# Patient Record
Sex: Male | Born: 1965 | Race: Black or African American | Hispanic: No | Marital: Married | Smoking: Never smoker
Health system: Southern US, Community
[De-identification: ages and names within clinical notes are randomized; demographics above are authoritative.]

## PROBLEM LIST (undated history)

## (undated) DIAGNOSIS — S86019A Strain of unspecified Achilles tendon, initial encounter: Secondary | ICD-10-CM

## (undated) HISTORY — PX: SOFT TISSUE BIOPSY: SHX1053

---

## 1999-01-22 ENCOUNTER — Emergency Department (HOSPITAL_COMMUNITY): Admission: EM | Admit: 1999-01-22 | Discharge: 1999-01-22 | Payer: Self-pay | Admitting: Emergency Medicine

## 1999-02-13 ENCOUNTER — Emergency Department (HOSPITAL_COMMUNITY): Admission: EM | Admit: 1999-02-13 | Discharge: 1999-02-13 | Payer: Self-pay

## 1999-04-24 ENCOUNTER — Encounter: Payer: Self-pay | Admitting: Emergency Medicine

## 1999-04-24 ENCOUNTER — Emergency Department (HOSPITAL_COMMUNITY): Admission: EM | Admit: 1999-04-24 | Discharge: 1999-04-24 | Payer: Self-pay | Admitting: Emergency Medicine

## 2007-04-20 ENCOUNTER — Ambulatory Visit (HOSPITAL_COMMUNITY)
Admission: RE | Admit: 2007-04-20 | Discharge: 2007-04-20 | Payer: Self-pay | Admitting: Physical Medicine and Rehabilitation

## 2014-08-22 ENCOUNTER — Other Ambulatory Visit: Payer: Self-pay | Admitting: Internal Medicine

## 2014-08-22 ENCOUNTER — Ambulatory Visit
Admission: RE | Admit: 2014-08-22 | Discharge: 2014-08-22 | Disposition: A | Discharge status: Home / Self Care | Payer: BC Managed Care – PPO | Source: Ambulatory Visit | Attending: Internal Medicine | Admitting: Internal Medicine

## 2014-08-22 ENCOUNTER — Encounter (INDEPENDENT_AMBULATORY_CARE_PROVIDER_SITE_OTHER): Payer: Self-pay

## 2014-08-22 DIAGNOSIS — M545 Low back pain: Secondary | ICD-10-CM

## 2015-04-09 ENCOUNTER — Other Ambulatory Visit: Payer: Self-pay | Admitting: Orthopaedic Surgery

## 2015-04-09 DIAGNOSIS — M25562 Pain in left knee: Secondary | ICD-10-CM

## 2015-04-19 ENCOUNTER — Ambulatory Visit
Admission: RE | Admit: 2015-04-19 | Discharge: 2015-04-19 | Disposition: A | Discharge status: Home / Self Care | Payer: BLUE CROSS/BLUE SHIELD | Source: Ambulatory Visit | Attending: Orthopaedic Surgery | Admitting: Orthopaedic Surgery

## 2015-04-19 DIAGNOSIS — M25562 Pain in left knee: Secondary | ICD-10-CM

## 2016-03-14 ENCOUNTER — Emergency Department (HOSPITAL_COMMUNITY)
Admission: EM | Admit: 2016-03-14 | Discharge: 2016-03-14 | Disposition: A | Discharge status: Home / Self Care | Payer: BLUE CROSS/BLUE SHIELD | Attending: Emergency Medicine | Admitting: Emergency Medicine

## 2016-03-14 ENCOUNTER — Encounter (HOSPITAL_COMMUNITY): Payer: Self-pay

## 2016-03-14 ENCOUNTER — Emergency Department (HOSPITAL_COMMUNITY): Payer: BLUE CROSS/BLUE SHIELD

## 2016-03-14 DIAGNOSIS — Y998 Other external cause status: Secondary | ICD-10-CM | POA: Insufficient documentation

## 2016-03-14 DIAGNOSIS — S86019A Strain of unspecified Achilles tendon, initial encounter: Secondary | ICD-10-CM

## 2016-03-14 DIAGNOSIS — S8991XA Unspecified injury of right lower leg, initial encounter: Secondary | ICD-10-CM | POA: Diagnosis present

## 2016-03-14 DIAGNOSIS — Y92310 Basketball court as the place of occurrence of the external cause: Secondary | ICD-10-CM | POA: Insufficient documentation

## 2016-03-14 DIAGNOSIS — X58XXXA Exposure to other specified factors, initial encounter: Secondary | ICD-10-CM | POA: Insufficient documentation

## 2016-03-14 DIAGNOSIS — S86001A Unspecified injury of right Achilles tendon, initial encounter: Secondary | ICD-10-CM | POA: Diagnosis not present

## 2016-03-14 DIAGNOSIS — Y9367 Activity, basketball: Secondary | ICD-10-CM | POA: Diagnosis not present

## 2016-03-14 HISTORY — DX: Strain of unspecified achilles tendon, initial encounter: S86.019A

## 2016-03-14 MED ORDER — OXYCODONE-ACETAMINOPHEN 5-325 MG PO TABS
1.0000 | ORAL_TABLET | Freq: Once | ORAL | Status: AC
  Administered 2016-03-14: 1 via ORAL
  Filled 2016-03-14: qty 1

## 2016-03-14 MED ORDER — OXYCODONE-ACETAMINOPHEN 5-325 MG PO TABS: 1.0000 | ORAL_TABLET | Freq: Four times a day (QID) | ORAL | Status: DC | PRN

## 2016-03-14 NOTE — ED Provider Notes (Signed)
CSN: [id number]     Arrival date & time 03/14/16  1322 History   By signing my name below, I, Jonathan [name], attest that this documentation has been prepared under the direction and in the presence of Chase Picket Ward, PA-C  Electronically Signed: Iona [name], ED Scribe 03/14/2016 at 3:50 PM.  Chief Complaint  Patient presents with  . Leg Pain   The history is provided by the patient. No language interpreter was used.   HPI Comments: Jonathan [name] is a 50 y.o. male who presents to the Emergency Department complaining of sudden onset, constant right leg pain, onset this afternoon. Pt reports he was playing basketball and stepped awkwardly onto the leg. He reports associated right leg swelling posteriorly above the ankle. No alleviating factors noted. Pain with weight-bearing, unable to ambulate. Pt denies knee pain, numbness, weakness, previous ankle or achilles surgery, or any other pertinent symptoms. Pt says he does not remember hearing a pop or snap with the injury. No recent ABX or steroid use. No medications taken prior to arrival.   History reviewed. No pertinent past medical history. Past Surgical History  Procedure Laterality Date  . Soft tissue biopsy     History reviewed. No pertinent family history. Social History  Substance Use Topics  . Smoking status: Never Smoker   . Smokeless tobacco: None  . Alcohol Use: No    Review of Systems  Musculoskeletal: Positive for joint swelling and arthralgias.  Neurological: Negative for weakness and numbness.   Allergies  Review of patient's allergies indicates no known allergies.  Home Medications   Prior to Admission medications   Medication Sig Start Date End Date Taking? Authorizing Provider  oxyCODONE-acetaminophen (PERCOCET/ROXICET) 5-325 MG tablet Take 1 tablet by mouth every 6 (six) hours as needed for severe pain. 03/14/16   Chase Picket Ward, PA-C   BP 112/72 mmHg  [name] 90  Temp(Src) 97.8 F (36.6 C)  (Oral)  Resp 16  SpO2 99% Physical Exam  Constitutional: He is oriented to person, place, and time. He appears well-developed and well-nourished.  HENT:  Head: Normocephalic and atraumatic.  Eyes: EOM are normal.  Cardiovascular: Normal rate, regular rhythm and normal heart sounds.  Exam reveals no gallop and no friction rub.   No murmur heard. Pulmonary/Chest: Effort normal and breath sounds normal. No respiratory distress. He has no wheezes. He has no rales.  Abdominal: Soft. He exhibits no distension. There is no tenderness.  Musculoskeletal:  Right lower extremity with + thompson's. Small palpable defect of the heel cord. Decreased ROM. + swelling. No erythema, ecchymosis, or warmth. Sensation intact. 2+ DP. Well healed surgical scar over achilles which patient states is from a soft tissue bx - no prior achilles injury.   Neurological: He is alert and oriented to person, place, and time.  Skin: Skin is warm and dry.  Cap refill < 3 seconds on bilateral lower extremities.   Psychiatric: He has a normal mood and affect.  Nursing note and vitals reviewed.   ED Course  Procedures (including critical care time) DIAGNOSTIC STUDIES: Oxygen Saturation is 99% on RA, normal by my interpretation.    COORDINATION OF CARE: 2:56 PM-Discussed treatment plan which includes DG tibia/fibula right with pt at bedside and pt agreed to plan.   Labs Review Labs Reviewed - No data to display  Imaging Review Dg Tibia/fibula Right  03/14/2016  CLINICAL DATA:  Pt was playing basketball, a player fell on his right leg and he heard a "pop" and  complains of pain in the dorsal area of the right ankle. There is EXAM: RIGHT TIBIA AND FIBULA - 2 VIEW COMPARISON:  None. FINDINGS: No fracture of the tibia or fibula. Knee joint and ankle joint appear normal on two views. IMPRESSION: No fracture or dislocation. Electronically Signed   By: Genevive BiStewart  Edmunds M.D.   On: 03/14/2016 14:06   I have personally reviewed  and evaluated these images as part of my medical decision-making.   EKG Interpretation None      MDM   Final diagnoses:  Achilles tendon injury, right, initial encounter   Jonathan [name] presents with acute onset of right lower leg pain after injury just prior to arrival while playing basketball. X-rays of the tib-fib were obtained which were unremarkable. On exam, patient with positive Thompson's test and palpable cord defect, likely Achilles tendon injury. Patient unable to ambulate in ED, therefore crutches were provided. Patient was placed in a posterior splint in plantar flexion. Orthopedic referral was given as well as pain medication. Home care instructions were given as well as strict return precautions. All questions answered.  I personally performed the services described in this documentation, which was scribed in my presence. The recorded information has been reviewed and is accurate.   [facility] Surgical Services LtdJaime Pilcher Ward, PA-C 03/14/16 1559  [name], MD 03/15/16 [phone]

## 2016-03-14 NOTE — Discharge Instructions (Signed)
1. Medications: Use pain medication only as needed - This can make you very drowsy - please do not drink or drive on this medication, continue usual home medications 2. Treatment: rest, keep leg elevated as much as possible, no weight-bearing until you are seen by the orthopedic physician - use crutches for ambulation.  3. Follow Up: Please follow up with the orthopedic clinic listed for discussion of your diagnoses and further evaluation after today's visit - you will need to call them first thing Monday morning. Please return to the ER for numbness or tingling, worsening pain, new or worsening symptoms, any additional concerns.

## 2016-03-14 NOTE — ED Notes (Signed)
Pt playing basketball.  Landed wrong on right leg.  Pt has swelling above right ankle distal leg.

## 2016-03-16 ENCOUNTER — Encounter (HOSPITAL_BASED_OUTPATIENT_CLINIC_OR_DEPARTMENT_OTHER): Payer: Self-pay | Admitting: *Deleted

## 2016-03-16 ENCOUNTER — Other Ambulatory Visit: Payer: Self-pay | Admitting: Orthopaedic Surgery

## 2016-03-18 ENCOUNTER — Ambulatory Visit (HOSPITAL_BASED_OUTPATIENT_CLINIC_OR_DEPARTMENT_OTHER)
Admission: RE | Admit: 2016-03-18 | Discharge: 2016-03-18 | Disposition: A | Discharge status: Home / Self Care | Payer: BLUE CROSS/BLUE SHIELD | Source: Ambulatory Visit | Attending: Orthopaedic Surgery | Admitting: Orthopaedic Surgery

## 2016-03-18 ENCOUNTER — Encounter (HOSPITAL_BASED_OUTPATIENT_CLINIC_OR_DEPARTMENT_OTHER)
Admission: RE | Disposition: A | Discharge status: Home / Self Care | Payer: Self-pay | Source: Ambulatory Visit | Attending: Orthopaedic Surgery

## 2016-03-18 ENCOUNTER — Encounter (HOSPITAL_BASED_OUTPATIENT_CLINIC_OR_DEPARTMENT_OTHER): Payer: Self-pay | Admitting: Certified Registered"

## 2016-03-18 ENCOUNTER — Ambulatory Visit (HOSPITAL_BASED_OUTPATIENT_CLINIC_OR_DEPARTMENT_OTHER): Payer: BLUE CROSS/BLUE SHIELD | Admitting: Certified Registered"

## 2016-03-18 DIAGNOSIS — X58XXXA Exposure to other specified factors, initial encounter: Secondary | ICD-10-CM | POA: Insufficient documentation

## 2016-03-18 DIAGNOSIS — S86011A Strain of right Achilles tendon, initial encounter: Secondary | ICD-10-CM | POA: Insufficient documentation

## 2016-03-18 HISTORY — DX: Strain of unspecified achilles tendon, initial encounter: S86.019A

## 2016-03-18 HISTORY — PX: ACHILLES TENDON SURGERY: SHX542

## 2016-03-18 SURGERY — REPAIR, TENDON, ACHILLES
Anesthesia: Regional | Site: Ankle | Laterality: Right

## 2016-03-18 MED ORDER — LIDOCAINE HCL (PF) 1 % IJ SOLN
INTRAMUSCULAR | Status: AC
  Filled 2016-03-18: qty 30

## 2016-03-18 MED ORDER — MIDAZOLAM HCL 2 MG/2ML IJ SOLN
1.0000 mg | INTRAMUSCULAR | Status: DC | PRN
  Administered 2016-03-18: 2 mg via INTRAVENOUS

## 2016-03-18 MED ORDER — MIDAZOLAM HCL 2 MG/2ML IJ SOLN
INTRAMUSCULAR | Status: AC
  Filled 2016-03-18: qty 2

## 2016-03-18 MED ORDER — ARTIFICIAL TEARS OP OINT
TOPICAL_OINTMENT | OPHTHALMIC | Status: DC | PRN
  Administered 2016-03-18: 1 via OPHTHALMIC

## 2016-03-18 MED ORDER — SENNOSIDES-DOCUSATE SODIUM 8.6-50 MG PO TABS: 1.0000 | ORAL_TABLET | Freq: Every evening | ORAL | Status: DC | PRN

## 2016-03-18 MED ORDER — PROPOFOL 10 MG/ML IV BOLUS
INTRAVENOUS | Status: AC
  Filled 2016-03-18: qty 20

## 2016-03-18 MED ORDER — PROPOFOL 10 MG/ML IV BOLUS
INTRAVENOUS | Status: DC | PRN
  Administered 2016-03-18: 200 mg via INTRAVENOUS

## 2016-03-18 MED ORDER — EPHEDRINE SULFATE 50 MG/ML IJ SOLN
INTRAMUSCULAR | Status: DC | PRN
  Administered 2016-03-18 (×2): 10 mg via INTRAVENOUS

## 2016-03-18 MED ORDER — ASPIRIN EC 325 MG PO TBEC: 325.0000 mg | DELAYED_RELEASE_TABLET | Freq: Two times a day (BID) | ORAL | Status: DC

## 2016-03-18 MED ORDER — ONDANSETRON HCL 4 MG/2ML IJ SOLN
INTRAMUSCULAR | Status: AC
  Filled 2016-03-18: qty 2

## 2016-03-18 MED ORDER — ONDANSETRON HCL 4 MG PO TABS: 4.0000 mg | ORAL_TABLET | Freq: Three times a day (TID) | ORAL | Status: DC | PRN

## 2016-03-18 MED ORDER — CEFAZOLIN SODIUM-DEXTROSE 2-4 GM/100ML-% IV SOLN
INTRAVENOUS | Status: AC
  Filled 2016-03-18: qty 100

## 2016-03-18 MED ORDER — BUPIVACAINE HCL (PF) 0.5 % IJ SOLN
INTRAMUSCULAR | Status: AC
  Filled 2016-03-18: qty 30

## 2016-03-18 MED ORDER — GLYCOPYRROLATE 0.2 MG/ML IJ SOLN: 0.2000 mg | Freq: Once | INTRAMUSCULAR | Status: DC | PRN

## 2016-03-18 MED ORDER — ONDANSETRON HCL 4 MG/2ML IJ SOLN
INTRAMUSCULAR | Status: DC | PRN
  Administered 2016-03-18: 4 mg via INTRAVENOUS

## 2016-03-18 MED ORDER — MEPERIDINE HCL 25 MG/ML IJ SOLN: 6.2500 mg | INTRAMUSCULAR | Status: DC | PRN

## 2016-03-18 MED ORDER — LIDOCAINE HCL (CARDIAC) 20 MG/ML IV SOLN
INTRAVENOUS | Status: DC | PRN
  Administered 2016-03-18: 100 mg via INTRAVENOUS

## 2016-03-18 MED ORDER — DEXAMETHASONE SODIUM PHOSPHATE 10 MG/ML IJ SOLN
INTRAMUSCULAR | Status: AC
  Filled 2016-03-18: qty 1

## 2016-03-18 MED ORDER — FENTANYL CITRATE (PF) 100 MCG/2ML IJ SOLN: 25.0000 ug | INTRAMUSCULAR | Status: DC | PRN

## 2016-03-18 MED ORDER — LIDOCAINE HCL 4 % EX SOLN
CUTANEOUS | Status: DC | PRN
  Administered 2016-03-18: 2 mL via TOPICAL

## 2016-03-18 MED ORDER — EPHEDRINE SULFATE 50 MG/ML IJ SOLN
INTRAMUSCULAR | Status: AC
  Filled 2016-03-18: qty 1

## 2016-03-18 MED ORDER — PROMETHAZINE HCL 25 MG/ML IJ SOLN: 6.2500 mg | INTRAMUSCULAR | Status: DC | PRN

## 2016-03-18 MED ORDER — FENTANYL CITRATE (PF) 100 MCG/2ML IJ SOLN
INTRAMUSCULAR | Status: AC
  Filled 2016-03-18: qty 2

## 2016-03-18 MED ORDER — SCOPOLAMINE 1 MG/3DAYS TD PT72
1.0000 | MEDICATED_PATCH | Freq: Once | TRANSDERMAL | Status: DC | PRN
Start: 2016-03-18 — End: 2016-03-18

## 2016-03-18 MED ORDER — OXYCODONE-ACETAMINOPHEN 5-325 MG PO TABS: 1.0000 | ORAL_TABLET | ORAL | Status: DC | PRN

## 2016-03-18 MED ORDER — OXYCODONE HCL ER 10 MG PO T12A: 10.0000 mg | EXTENDED_RELEASE_TABLET | Freq: Two times a day (BID) | ORAL | Status: DC

## 2016-03-18 MED ORDER — HYDROCODONE-ACETAMINOPHEN 7.5-325 MG/15ML PO SOLN: 5.0000 mL | Freq: Four times a day (QID) | ORAL | Status: DC | PRN

## 2016-03-18 MED ORDER — LACTATED RINGERS IV SOLN: INTRAVENOUS | Status: DC

## 2016-03-18 MED ORDER — BUPIVACAINE-EPINEPHRINE (PF) 0.5% -1:200000 IJ SOLN
INTRAMUSCULAR | Status: DC | PRN
  Administered 2016-03-18: 20 mL via PERINEURAL

## 2016-03-18 MED ORDER — LACTATED RINGERS IV SOLN
INTRAVENOUS | Status: DC
  Administered 2016-03-18 (×3): via INTRAVENOUS

## 2016-03-18 MED ORDER — ARTIFICIAL TEARS OP OINT
TOPICAL_OINTMENT | OPHTHALMIC | Status: AC
  Filled 2016-03-18: qty 3.5

## 2016-03-18 MED ORDER — ROCURONIUM BROMIDE 100 MG/10ML IV SOLN
INTRAVENOUS | Status: DC | PRN
Start: 2016-03-18 — End: 2016-03-18
  Administered 2016-03-18: 30 mg via INTRAVENOUS

## 2016-03-18 MED ORDER — SUGAMMADEX SODIUM 200 MG/2ML IV SOLN
INTRAVENOUS | Status: DC | PRN
  Administered 2016-03-18: 200 mg via INTRAVENOUS

## 2016-03-18 MED ORDER — DEXAMETHASONE SODIUM PHOSPHATE 4 MG/ML IJ SOLN
INTRAMUSCULAR | Status: DC | PRN
  Administered 2016-03-18: 10 mg via INTRAVENOUS

## 2016-03-18 MED ORDER — FENTANYL CITRATE (PF) 100 MCG/2ML IJ SOLN
50.0000 ug | INTRAMUSCULAR | Status: DC | PRN
  Administered 2016-03-18: 100 ug via INTRAVENOUS

## 2016-03-18 MED ORDER — CEFAZOLIN SODIUM-DEXTROSE 2-4 GM/100ML-% IV SOLN
2.0000 g | INTRAVENOUS | Status: AC
  Administered 2016-03-18: 2 g via INTRAVENOUS

## 2016-03-18 MED ORDER — BUPIVACAINE HCL (PF) 0.25 % IJ SOLN
INTRAMUSCULAR | Status: AC
  Filled 2016-03-18: qty 30

## 2016-03-18 SURGICAL SUPPLY — 66 items
BANDAGE ACE 4X5 VEL STRL LF (GAUZE/BANDAGES/DRESSINGS) IMPLANT
BANDAGE ACE 6X5 VEL STRL LF (GAUZE/BANDAGES/DRESSINGS) ×2 IMPLANT
BANDAGE ESMARK 6X9 LF (GAUZE/BANDAGES/DRESSINGS) ×1 IMPLANT
BLADE HEX COATED 2.75 (ELECTRODE) ×2 IMPLANT
BLADE SURG 15 STRL LF DISP TIS (BLADE) ×2 IMPLANT
BLADE SURG 15 STRL SS (BLADE) ×2
BNDG COHESIVE 3X5 TAN STRL LF (GAUZE/BANDAGES/DRESSINGS) ×2 IMPLANT
BNDG ESMARK 6X9 LF (GAUZE/BANDAGES/DRESSINGS) ×2
CANISTER SUCT 1200ML W/VALVE (MISCELLANEOUS) ×2 IMPLANT
COVER BACK TABLE 60X90IN (DRAPES) ×2 IMPLANT
CUFF TOURNIQUET SINGLE 24IN (TOURNIQUET CUFF) IMPLANT
CUFF TOURNIQUET SINGLE 34IN LL (TOURNIQUET CUFF) ×2 IMPLANT
DECANTER SPIKE VIAL GLASS SM (MISCELLANEOUS) IMPLANT
DRAPE EXTREMITY T 121X128X90 (DRAPE) ×2 IMPLANT
DRAPE SURG 17X23 STRL (DRAPES) ×4 IMPLANT
DRSG PAD ABDOMINAL 8X10 ST (GAUZE/BANDAGES/DRESSINGS) ×2 IMPLANT
DURAPREP 26ML APPLICATOR (WOUND CARE) ×2 IMPLANT
ELECT REM PT RETURN 9FT ADLT (ELECTROSURGICAL) ×2
ELECTRODE REM PT RTRN 9FT ADLT (ELECTROSURGICAL) ×1 IMPLANT
GAUZE SPONGE 4X4 12PLY STRL (GAUZE/BANDAGES/DRESSINGS) ×2 IMPLANT
GAUZE SPONGE 4X4 16PLY XRAY LF (GAUZE/BANDAGES/DRESSINGS) IMPLANT
GAUZE XEROFORM 1X8 LF (GAUZE/BANDAGES/DRESSINGS) ×2 IMPLANT
GLOVE BIOGEL PI IND STRL 7.0 (GLOVE) ×1 IMPLANT
GLOVE BIOGEL PI INDICATOR 7.0 (GLOVE) ×1
GLOVE ECLIPSE 6.5 STRL STRAW (GLOVE) ×2 IMPLANT
GLOVE SKINSENSE NS SZ7.5 (GLOVE) ×1
GLOVE SKINSENSE STRL SZ7.5 (GLOVE) ×1 IMPLANT
GLOVE SURG SYN 7.5  E (GLOVE) ×1
GLOVE SURG SYN 7.5 E (GLOVE) ×1 IMPLANT
GOWN STRL REIN XL XLG (GOWN DISPOSABLE) ×2 IMPLANT
GOWN STRL REUS W/ TWL LRG LVL3 (GOWN DISPOSABLE) ×1 IMPLANT
GOWN STRL REUS W/TWL LRG LVL3 (GOWN DISPOSABLE) ×1
KIT IMPLANT SUTURE PARS (Kit) ×2 IMPLANT
NEEDLE HYPO 22GX1.5 SAFETY (NEEDLE) IMPLANT
NS IRRIG 1000ML POUR BTL (IV SOLUTION) ×2 IMPLANT
PACK BASIN DAY SURGERY FS (CUSTOM PROCEDURE TRAY) ×2 IMPLANT
PAD CAST 3X4 CTTN HI CHSV (CAST SUPPLIES) IMPLANT
PAD CAST 4YDX4 CTTN HI CHSV (CAST SUPPLIES) ×1 IMPLANT
PADDING CAST COTTON 3X4 STRL (CAST SUPPLIES)
PADDING CAST COTTON 4X4 STRL (CAST SUPPLIES) ×1
PADDING CAST COTTON 6X4 STRL (CAST SUPPLIES) ×2 IMPLANT
PADDING CAST SYN 6 (CAST SUPPLIES)
PADDING CAST SYNTHETIC 4 (CAST SUPPLIES)
PADDING CAST SYNTHETIC 4X4 STR (CAST SUPPLIES) IMPLANT
PADDING CAST SYNTHETIC 6X4 NS (CAST SUPPLIES) IMPLANT
PENCIL BUTTON HOLSTER BLD 10FT (ELECTRODE) ×2 IMPLANT
SLEEVE SCD COMPRESS KNEE MED (MISCELLANEOUS) ×2 IMPLANT
SPLINT FIBERGLASS 3X35 (CAST SUPPLIES) IMPLANT
SPLINT FIBERGLASS 4X30 (CAST SUPPLIES) ×2 IMPLANT
SPONGE LAP 18X18 X RAY DECT (DISPOSABLE) ×2 IMPLANT
SPONGE LAP 4X18 X RAY DECT (DISPOSABLE) IMPLANT
STAPLER VISISTAT (STAPLE) IMPLANT
SUCTION FRAZIER HANDLE 10FR (MISCELLANEOUS)
SUCTION TUBE FRAZIER 10FR DISP (MISCELLANEOUS) IMPLANT
SUT ETHILON 2 0 FS 18 (SUTURE) ×2 IMPLANT
SUT ETHILON 3 0 PS 1 (SUTURE) IMPLANT
SUT FIBERWIRE #2 38 T-5 BLUE (SUTURE)
SUT VIC AB 2-0 CT1 27 (SUTURE) ×1
SUT VIC AB 2-0 CT1 TAPERPNT 27 (SUTURE) ×1 IMPLANT
SUTURE FIBERWR #2 38 T-5 BLUE (SUTURE) IMPLANT
SYR BULB 3OZ (MISCELLANEOUS) ×2 IMPLANT
SYR CONTROL 10ML LL (SYRINGE) IMPLANT
TOWEL OR 17X24 6PK STRL BLUE (TOWEL DISPOSABLE) ×2 IMPLANT
TUBE CONNECTING 20X1/4 (TUBING) IMPLANT
UNDERPAD 30X30 (UNDERPADS AND DIAPERS) ×2 IMPLANT
YANKAUER SUCT BULB TIP NO VENT (SUCTIONS) IMPLANT

## 2016-03-18 NOTE — Anesthesia Preprocedure Evaluation (Signed)
Anesthesia Evaluation  Patient identified by MRN, date of birth, ID band Patient awake    Reviewed: Allergy & Precautions, NPO status , Patient's Chart, lab work & pertinent test results  Airway Mallampati: II  TM Distance: >3 FB Neck ROM: Full    Dental no notable dental hx.    Pulmonary neg pulmonary ROS,    Pulmonary exam normal breath sounds clear to auscultation       Cardiovascular negative cardio ROS Normal cardiovascular exam Rhythm:Regular Rate:Normal     Neuro/Psych negative neurological ROS  negative psych ROS   GI/Hepatic negative GI ROS, Neg liver ROS,   Endo/Other  negative endocrine ROS  Renal/GU negative Renal ROS  negative genitourinary   Musculoskeletal negative musculoskeletal ROS (+)   Abdominal   Peds negative pediatric ROS (+)  Hematology negative hematology ROS (+)   Anesthesia Other Findings   Reproductive/Obstetrics negative OB ROS                             Anesthesia Physical Anesthesia Plan  ASA: I  Anesthesia Plan: General   Post-op Pain Management: GA combined w/ Regional for post-op pain   Induction: Intravenous  Airway Management Planned: LMA and Oral ETT  Additional Equipment:   Intra-op Plan:   Post-operative Plan: Extubation in OR  Informed Consent: I have reviewed the patients History and Physical, chart, labs and discussed the procedure including the risks, benefits and alternatives for the proposed anesthesia with the patient or authorized representative who has indicated his/her understanding and acceptance.   Dental advisory given  Plan Discussed with: CRNA  Anesthesia Plan Comments: (Popliteal block)        Anesthesia Quick Evaluation

## 2016-03-18 NOTE — Op Note (Signed)
   Date of Surgery: 03/18/2016  INDICATIONS: Mr. Jonathan [name] is a 50 y.o.-year-old male who sustained an acute right achilles rupture. The risks and benefits of the procedure discussed with the patient prior to the procedure and all questions were answered; consent was obtained.  PREOPERATIVE DIAGNOSIS: right achilles rupture, acute  POSTOPERATIVE DIAGNOSIS: Same   PROCEDURE: Treatment of right achilles rupture , without graft  SURGEON: N. Glee ArvinMichael Xu, M.D.   ANESTHESIA: general   IV FLUIDS AND URINE: See anesthesia record   ESTIMATED BLOOD LOSS: minimal  IMPLANTS: [employer] PARS system  COMPLICATIONS: None.   DESCRIPTION OF PROCEDURE: The patient was brought to the operating room and placed prone on the operating table. The patient's leg had been signed prior to the procedure. The patient had the anesthesia placed by the anesthesiologist. The prep verification and incision time-outs were performed to confirm that this was the correct patient, site, side and location. The patient had an SCD on the opposite lower extremity. A 2 cm longitudinal incision based over the rupture was used.  Blunt dissection was taken down to the level of the paratenon.  The paratenon was sharply incised in line with the incision.  The rupture was exposed.  A space was created between the paratenon and the achilles tendon on both sides going up and down the tendon.  The sural nerve was visualized and protected during the procedure.  An alise clamp was used to pull tension on the tendon end.  The sled was advanced up the tendon proximally making sure the tendon was straddled between the arms.  The sutures were passed sequentially and then brought out through the incision.  The same steps were then repeated for the distal portion of the tendon.  The foot was then placed in maximum plantarflexion and the sutures were tied down.  The wound was then irrigated thoroughly.  The paratenon was was closed using 0 vicryl.  The  subcutaneous layer was closed with 2-0 vicryl and the skin with interrupted 2-0 nylon.  A sterile dressing was applied.  A short leg splint was placed with the ankle in maximum plantarflexion.  The patient awoke from anesthesia uneventfully and transported to the PACU.  POSTOPERATIVE PLAN: The patient will be non weight bearing for two weeks and return for suture removal and transition to a fracture boot.  The patient will be placed on DVT chemoprophylaxis.  Mayra ReelN. Michael Xu, MD [facility] [phone] 2:58 PM

## 2016-03-18 NOTE — Discharge Instructions (Signed)
Postoperative instructions: ° °Weightbearing: non weight bearing ° °Dressing instructions: Keep your dressing and/or splint clean and dry at all times.  It will be removed at your first post-operative appointment.  Your stitches and/or staples will be removed at this visit. ° °Incision instructions:  Do not soak your incision for 3 weeks after surgery.  If the incision gets wet, pat dry and do not scrub the incision. ° °Pain control:  You have been given a prescription to be taken as directed for post-operative pain control.  In addition, elevate the operative extremity above the heart at all times to prevent swelling and throbbing pain. ° °Take over-the-counter Colace, 100mg by mouth twice a day while taking narcotic pain medications to help prevent constipation. ° °Follow up appointments: °1) 10-14 days for suture removal and wound check. °2) Dr. Xu as scheduled. ° ° ------------------------------------------------------------------------------------------------------------- ° °After Surgery Pain Control: ° °After your surgery, post-surgical discomfort or pain is likely. This discomfort can last several days to a few weeks. At certain times of the day your discomfort may be more intense.  °Did you receive a nerve block?  °A nerve block can provide pain relief for one hour to two days after your surgery. As long as the nerve block is working, you will experience little or no sensation in the area the surgeon operated on.  °As the nerve block wears off, you will begin to experience pain or discomfort. It is very important that you begin taking your prescribed pain medication before the nerve block fully wears off. Treating your pain at the first sign of the block wearing off will ensure your pain is better controlled and more tolerable when full-sensation returns. Do not wait until the pain is intolerable, as the medicine will be less effective. It is better to treat pain in advance than to try and catch up.    °General Anesthesia:  °If you did not receive a nerve block during your surgery, you will need to start taking your pain medication shortly after your surgery and should continue to do so as prescribed by your surgeon.  °Pain Medication:  °Most commonly we prescribe Vicodin and Percocet for post-operative pain. Both of these medications contain a combination of acetaminophen (Tylenol®) and a narcotic to help control pain.  °· It takes between 30 and 45 minutes before pain medication starts to work. It is important to take your medication before your pain level gets too intense.  °· Nausea is a common side effect of many pain medications. You will want to eat something before taking your pain medicine to help prevent nausea.  °· If you are taking a prescription pain medication that contains acetaminophen, we recommend that you do not take additional over the counter acetaminophen (Tylenol®).  °Other pain relieving options:  °· Using a cold pack to ice the affected area a few times a day (15 to 20 minutes at a time) can help to relieve pain, reduce swelling and bruising.  °· Elevation of the affected area can also help to reduce pain and swelling. ° ° ° ° ° ° °Regional Anesthesia Blocks ° °1. Numbness or the inability to move the "blocked" extremity may last from 3-48 hours after placement. The length of time depends on the medication injected and your individual response to the medication. If the numbness is not going away after 48 hours, call your surgeon. ° °2. The extremity that is blocked will need to be protected until the numbness is gone and the    Strength has returned. Because you cannot feel it, you will need to take extra care to avoid injury. Because it may be weak, you may have difficulty moving it or using it. You may not know what position it is in without looking at it while the block is in effect. ° °3. For blocks in the legs and feet, returning to weight bearing and walking needs to be done  carefully. You will need to wait until the numbness is entirely gone and the strength has returned. You should be able to move your leg and foot normally before you try and bear weight or walk. You will need someone to be with you when you first try to ensure you do not fall and possibly risk injury. ° °4. Bruising and tenderness at the needle site are common side effects and will resolve in a few days. ° °5. Persistent numbness or new problems with movement should be communicated to the surgeon or the [facility] ([phone])/ [facility] ([phone]). ° ° ° ° ° °Post Anesthesia Home Care Instructions ° °Activity: °Get plenty of rest for the remainder of the day. A responsible adult should stay with you for 24 hours following the procedure.  °For the next 24 hours, DO NOT: °-Drive a car °-Operate machinery °-Drink alcoholic beverages °-Take any medication unless instructed by your physician °-Make any legal decisions or sign important papers. ° °Meals: °Start with liquid foods such as gelatin or soup. Progress to regular foods as tolerated. Avoid greasy, spicy, heavy foods. If nausea and/or vomiting occur, drink only clear liquids until the nausea and/or vomiting subsides. Call your physician if vomiting continues. ° °Special Instructions/Symptoms: °Your throat may feel dry or sore from the anesthesia or the breathing tube placed in your throat during surgery. If this causes discomfort, gargle with warm salt water. The discomfort should disappear within 24 hours. ° °If you had a scopolamine patch placed behind your ear for the management of post- operative nausea and/or vomiting: ° °1. The medication in the patch is effective for 72 hours, after which it should be removed.  Wrap patch in a tissue and discard in the trash. Wash hands thoroughly with soap and water. °2. You may remove the patch earlier than 72 hours if you experience unpleasant side effects which may include dry mouth,  dizziness or visual disturbances. °3. Avoid touching the patch. Wash your hands with soap and water after contact with the patch. °  ° ° °

## 2016-03-18 NOTE — H&P (Signed)
    PREOPERATIVE H&P  Chief Complaint: right achilles tendon rupture  HPI: Jonathan [name] is a 50 y.o. male who presents for surgical treatment of right achilles tendon rupture.  He denies any changes in medical history.  Past Medical History  Diagnosis Date  . Achilles tendon rupture 03/14/2016    right   Past Surgical History  Procedure Laterality Date  . Soft tissue biopsy Right     heel   Social History   Social History  . Marital Status: Married    Spouse Name: N/A  . Number of Children: N/A  . Years of Education: N/A   Social History Main Topics  . Smoking status: Never Smoker   . Smokeless tobacco: Never Used  . Alcohol Use: No  . Drug Use: No  . Sexual Activity: Not Asked   Other Topics Concern  . None   Social History Narrative   History reviewed. No pertinent family history. No Known Allergies Prior to Admission medications   Medication Sig Start Date End Date Taking? Authorizing Provider  Multiple Vitamin (MULTIVITAMIN) tablet Take 1 tablet by mouth daily.   Yes Historical Provider, MD  oxyCODONE-acetaminophen (PERCOCET/ROXICET) 5-325 MG tablet Take 1 tablet by mouth every 6 (six) hours as needed for severe pain. 03/14/16  Yes [name], PA-C     Positive ROS: All other systems have been reviewed and were otherwise negative with the exception of those mentioned in the HPI and as above.  Physical Exam: General: Alert, no acute distress Cardiovascular: No pedal edema Respiratory: No cyanosis, no use of accessory musculature GI: abdomen soft Skin: No lesions in the area of chief complaint Neurologic: Sensation intact distally Psychiatric: Patient is competent for consent with normal mood and affect Lymphatic: no lymphedema  MUSCULOSKELETAL: exam stable  Assessment: right achilles tendon rupture  Plan: Plan for Procedure(s): RIGHT ACHILLES TENDON REPAIR  The risks benefits and alternatives were discussed with the patient including but  not limited to the risks of nonoperative treatment, versus surgical intervention including infection, bleeding, nerve injury,  blood clots, cardiopulmonary complications, morbidity, mortality, among others, and they were willing to proceed.   Cheral AlmasXu, Tnya Ades Michael, MD   03/18/2016 7:27 AM

## 2016-03-18 NOTE — Transfer of Care (Signed)
Immediate Anesthesia Transfer of Care Note  Patient: Jonathan [name]  Procedure(s) Performed: Procedure(s): RIGHT ACHILLES TENDON REPAIR (Right)  Patient Location: PACU  Anesthesia Type:GA combined with regional for post-op pain  Level of Consciousness: awake, alert  and responds to stimulation  Airway & Oxygen Therapy: Patient Spontanous Breathing and Patient connected to face mask oxygen  Post-op Assessment: Report given to RN, Post -op Vital signs reviewed and stable and Patient moving all extremities  Post vital signs: Reviewed and stable  Last Vitals:  Filed Vitals:   03/18/16 1340 03/18/16 1345  BP:  152/75  Pulse: 81 79  Temp:    Resp: 10 18    Complications: No apparent anesthesia complications

## 2016-03-18 NOTE — Anesthesia Postprocedure Evaluation (Signed)
Anesthesia Post Note  Patient: Jonathan [name]  Procedure(s) Performed: Procedure(s) (LRB): RIGHT ACHILLES TENDON REPAIR (Right)  Patient location during evaluation: PACU Anesthesia Type: General and Regional Level of consciousness: awake and alert Pain management: pain level controlled Vital Signs Assessment: post-procedure vital signs reviewed and stable Respiratory status: spontaneous breathing, nonlabored ventilation, respiratory function stable and patient connected to nasal cannula oxygen Cardiovascular status: blood pressure returned to baseline and stable Postop Assessment: no signs of nausea or vomiting Anesthetic complications: no    Last Vitals:  Filed Vitals:   03/18/16 1530 03/18/16 1545  BP: 126/70 119/72  Pulse: 88 88  Temp:    Resp: 9 15    Last Pain:  Filed Vitals:   03/18/16 1547  PainSc: 0-No pain                 Phillips Groutarignan, Lannette Avellino

## 2016-03-18 NOTE — Anesthesia Procedure Notes (Addendum)
Anesthesia Regional Block:  Popliteal block  Pre-Anesthetic Checklist: ,, timeout performed, Correct Patient, Correct Site, Correct Laterality, Correct Procedure, Correct Position, site marked, Risks and benefits discussed,  Surgical consent,  Pre-op evaluation,  At surgeon's request and post-op pain management  Laterality: Right and Lower  Prep: Maximum Sterile Barrier Precautions used and chloraprep       Needles:  Injection technique: Single-shot  Needle Type: Echogenic Stimulator Needle     Needle Length: 10cm 10 cm Needle Gauge: 21 and 21 G    Additional Needles:  Procedures: ultrasound guided (picture in chart) and nerve stimulator Popliteal block Narrative:  Injection made incrementally with aspirations every 5 mL.  Performed by: Personally   Additional Notes: Patient tolerated the procedure well without complications   Procedure Name: Intubation Date/Time: 03/18/2016 2:02 PM Performed by: Baxter Flattery Pre-anesthesia Checklist: Patient identified, Emergency Drugs available, Suction available and Patient being monitored Patient Re-evaluated:Patient Re-evaluated prior to inductionOxygen Delivery Method: Circle System Utilized Preoxygenation: Pre-oxygenation with 100% oxygen Intubation Type: IV induction Ventilation: Mask ventilation without difficulty Laryngoscope Size: Mac and 2 Grade View: Grade II Tube type: Oral Tube size: 8.0 mm Number of attempts: 2 Airway Equipment and Method: Stylet,  Oral airway and LTA kit utilized Placement Confirmation: ETT inserted through vocal cords under direct vision,  positive ETCO2 and breath sounds checked- equal and bilateral Secured at: 24 cm Tube secured with: Tape Dental Injury: Teeth and Oropharynx as per pre-operative assessment

## 2016-03-18 NOTE — Progress Notes (Signed)
Assisted Dr. Carignan with right, ultrasound guided, popliteal block. Side rails up, monitors on throughout procedure. See vital signs in flow sheet. Tolerated Procedure well. 

## 2016-03-19 ENCOUNTER — Encounter (HOSPITAL_BASED_OUTPATIENT_CLINIC_OR_DEPARTMENT_OTHER): Payer: Self-pay | Admitting: Orthopaedic Surgery

## 2016-10-01 ENCOUNTER — Ambulatory Visit (INDEPENDENT_AMBULATORY_CARE_PROVIDER_SITE_OTHER): Payer: BLUE CROSS/BLUE SHIELD | Admitting: Orthopaedic Surgery

## 2016-10-01 DIAGNOSIS — S86011D Strain of right Achilles tendon, subsequent encounter: Secondary | ICD-10-CM | POA: Diagnosis not present

## 2016-10-22 ENCOUNTER — Encounter (INDEPENDENT_AMBULATORY_CARE_PROVIDER_SITE_OTHER): Payer: Self-pay | Admitting: Orthopaedic Surgery

## 2016-10-22 ENCOUNTER — Ambulatory Visit (INDEPENDENT_AMBULATORY_CARE_PROVIDER_SITE_OTHER): Payer: BLUE CROSS/BLUE SHIELD | Admitting: Orthopaedic Surgery

## 2016-10-22 DIAGNOSIS — S86011S Strain of right Achilles tendon, sequela: Secondary | ICD-10-CM | POA: Insufficient documentation

## 2016-10-22 DIAGNOSIS — S86011D Strain of right Achilles tendon, subsequent encounter: Secondary | ICD-10-CM | POA: Diagnosis not present

## 2016-10-22 NOTE — Progress Notes (Signed)
   Office Visit Note   Patient: Jonathan [name]           Date of Birth: 04/27/1966           MRN: [id number] Visit Date: 10/22/2016              Requested by: Georgann HousekeeperKarrar Husain, MD [address] [place], [place] [address] PCP: Georgann HousekeeperHUSAIN,KARRAR, MD   Assessment & Plan: Visit Diagnoses:  1. Achilles rupture, right, subsequent encounter     Plan:  - pennsaid sample given for heel, will call if he wants Rx - infection has cleared up - f/u prn  Follow-Up Instructions: Return if symptoms worsen or fail to improve.   Orders:  No orders of the defined types were placed in this encounter.  No orders of the defined types were placed in this encounter.     Procedures: No procedures performed   Clinical Data: No additional findings.   Subjective: Chief Complaint  Patient presents with  . Right Ankle - Follow-up    F/u visit for suture abscess.  Po abx have cleared up the infection.  Back to baseline essentially.    Review of Systems   Objective: Vital Signs: There were no vitals taken for this visit.  Physical Exam  Right Ankle Exam   Comments:  No signs of residual infection.  Tenderness around heel.  No swelling or signs of infection.      Specialty Comments:  No specialty comments available.  Imaging: No results found.   PMFS History: Patient Active Problem List   Diagnosis Date Noted  . Achilles rupture, right, subsequent encounter 10/22/2016   Past Medical History:  Diagnosis Date  . Achilles tendon rupture 03/14/2016   right    No family history on file.  Past Surgical History:  Procedure Laterality Date  . ACHILLES TENDON SURGERY Right 03/18/2016   Procedure: RIGHT ACHILLES TENDON REPAIR;  Surgeon: Tarry KosNaiping M Xu, MD;  Location: [facility];  Service: Orthopedics;  Laterality: Right;  . SOFT TISSUE BIOPSY Right    heel   Social History   Occupational History  . Not on file.   Social History Main Topics  . Smoking  status: Never Smoker  . Smokeless tobacco: Never Used  . Alcohol use No  . Drug use: No  . Sexual activity: Not on file

## 2016-10-27 ENCOUNTER — Other Ambulatory Visit (INDEPENDENT_AMBULATORY_CARE_PROVIDER_SITE_OTHER): Payer: Self-pay | Admitting: Orthopaedic Surgery

## 2016-10-27 ENCOUNTER — Telehealth (INDEPENDENT_AMBULATORY_CARE_PROVIDER_SITE_OTHER): Payer: Self-pay | Admitting: Orthopaedic Surgery

## 2016-10-27 MED ORDER — DICLOFENAC SODIUM 2 % TD SOLN: TRANSDERMAL | 0 refills | Status: DC

## 2016-10-27 NOTE — Telephone Encounter (Signed)
Patient requesting RX refill Pennsaid.  Contact Info: [phone]

## 2016-10-27 NOTE — Telephone Encounter (Signed)
rx sent to pharm

## 2016-10-27 NOTE — Telephone Encounter (Signed)
yes

## 2016-10-27 NOTE — Telephone Encounter (Signed)
Please advise 

## 2017-01-11 ENCOUNTER — Ambulatory Visit (INDEPENDENT_AMBULATORY_CARE_PROVIDER_SITE_OTHER): Payer: Self-pay

## 2017-01-11 ENCOUNTER — Ambulatory Visit (INDEPENDENT_AMBULATORY_CARE_PROVIDER_SITE_OTHER): Payer: BLUE CROSS/BLUE SHIELD | Admitting: Orthopaedic Surgery

## 2017-01-11 ENCOUNTER — Encounter (INDEPENDENT_AMBULATORY_CARE_PROVIDER_SITE_OTHER): Payer: Self-pay | Admitting: Orthopaedic Surgery

## 2017-01-11 DIAGNOSIS — S86011D Strain of right Achilles tendon, subsequent encounter: Secondary | ICD-10-CM

## 2017-01-11 NOTE — Progress Notes (Signed)
   Office Visit Note   Patient: Pecolia Adesimothy Vizzini           Date of Birth: 09/30/1966           MRN: [id number] Visit Date: 01/11/2017              Requested by: Georgann HousekeeperKarrar Husain, MD [address] [place], [place] [address] PCP: Georgann HousekeeperHUSAIN,KARRAR, MD   Assessment & Plan: Visit Diagnoses:  1. 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 Recommend MRI to fully evaluate ankle. We did discuss injection but he would like to hold off on this until the MRI comes back.  Follow-Up Instructions: Return in about 10 days (around 01/21/2017) for Review MRI.   Orders:  Orders Placed This Encounter  Procedures  . XR Ankle Complete Right  . MR Ankle Right w/o contrast   No orders of the defined types were placed in this encounter.     Procedures: No procedures performed   Clinical Data: No additional findings.   Subjective: Chief Complaint  Patient presents with  . Right Ankle - Follow-up    Patient comes in today for continued right ankle pain. He has been dealing with this for several months. We have been treating it conservatively with NSAIDs and activity modification but he continues to hurt especially with weightbearing. His Achilles is not symptomatic.    Review of Systems   Objective: Vital Signs: There were no vitals taken for this visit.  Physical Exam  Ortho Exam Exam of the right ankle shows no significant swelling. He does have tenderness to palpation in the lateral gutter. Ankle joint is stable. Specialty Comments:  No specialty comments available.  Imaging: Xr Ankle Complete Right  Result Date: 01/11/2017 No acute findings. Osteophyte of anterior distal tibia with mild degenerative of the ankle joint    PMFS History: Patient Active Problem List   Diagnosis Date Noted  . Achilles rupture, right, subsequent encounter 10/22/2016   Past Medical History:  Diagnosis Date  . Achilles tendon  rupture 03/14/2016   right    No family history on file.  Past Surgical History:  Procedure Laterality Date  . ACHILLES TENDON SURGERY Right 03/18/2016   Procedure: RIGHT ACHILLES TENDON REPAIR;  Surgeon: Tarry KosNaiping M Pierce Barocio, MD;  Location: [facility];  Service: Orthopedics;  Laterality: Right;  . SOFT TISSUE BIOPSY Right    heel   Social History   Occupational History  . Not on file.   Social History Main Topics  . Smoking status: Never Smoker  . Smokeless tobacco: Never Used  . Alcohol use No  . Drug use: No  . Sexual activity: Not on file

## 2017-01-21 ENCOUNTER — Ambulatory Visit (INDEPENDENT_AMBULATORY_CARE_PROVIDER_SITE_OTHER): Payer: BLUE CROSS/BLUE SHIELD | Admitting: Orthopaedic Surgery

## 2017-01-31 ENCOUNTER — Ambulatory Visit
Admission: RE | Admit: 2017-01-31 | Discharge: 2017-01-31 | Disposition: A | Discharge status: Home / Self Care | Payer: BLUE CROSS/BLUE SHIELD | Source: Ambulatory Visit | Attending: Orthopaedic Surgery | Admitting: Orthopaedic Surgery

## 2017-01-31 DIAGNOSIS — S86011D Strain of right Achilles tendon, subsequent encounter: Secondary | ICD-10-CM

## 2017-02-01 ENCOUNTER — Encounter (INDEPENDENT_AMBULATORY_CARE_PROVIDER_SITE_OTHER): Payer: Self-pay | Admitting: Orthopaedic Surgery

## 2017-02-01 ENCOUNTER — Ambulatory Visit (INDEPENDENT_AMBULATORY_CARE_PROVIDER_SITE_OTHER): Payer: BLUE CROSS/BLUE SHIELD | Admitting: Orthopaedic Surgery

## 2017-02-01 DIAGNOSIS — M79671 Pain in right foot: Secondary | ICD-10-CM | POA: Diagnosis not present

## 2017-02-01 DIAGNOSIS — S86011D Strain of right Achilles tendon, subsequent encounter: Secondary | ICD-10-CM | POA: Diagnosis not present

## 2017-02-01 MED ORDER — DICLOFENAC SODIUM 2 % TD SOLN: TRANSDERMAL | 6 refills | Status: DC

## 2017-02-01 NOTE — Progress Notes (Signed)
   Office Visit Note   Patient: Jonathan [name]           Date of Birth: 09/30/1966           MRN: [id number] Visit Date: 02/01/2017              Requested by: Georgann HousekeeperKarrar Husain, MD [address] [place], [place] [address] PCP: Georgann HousekeeperHUSAIN,KARRAR, MD   Assessment & Plan: Visit Diagnoses:  1. Achilles rupture, right, subsequent encounter   2. 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  I think he's more symptomatic from this than his possible coalition.  Custom arch support to biotech given.  pennsaid refilled.  F/u prn.  Follow-Up Instructions: Return if symptoms worsen or fail to improve.   Orders:  No orders of the defined types were placed in this encounter.  Meds ordered this encounter  Medications  . Diclofenac Sodium (PENNSAID) 2 % SOLN    Sig: Apply prn    Dispense:  1 Bottle    Refill:  6      Procedures: No procedures performed   Clinical Data: No additional findings.   Subjective: Chief Complaint  Patient presents with  . Right Ankle - Pain    Patient here to review MRI.  Pain is stable better with arch support.    Review of Systems  Constitutional: Negative.   All other systems reviewed and are negative.    Objective: Vital Signs: There were no vitals taken for this visit.  Physical Exam  Constitutional: He is oriented to person, place, and time. He appears well-developed and well-nourished.  Pulmonary/Chest: Effort normal.  Abdominal: Soft.  Neurological: He is alert and oriented to person, place, and time.  Skin: Skin is warm.  Psychiatric: He has a normal mood and affect. His behavior is normal. Judgment and thought content normal.  Nursing note and vitals reviewed.   Ortho Exam Exam of right foot is stable Specialty Comments:  No specialty comments available.  Imaging: No results found.   PMFS History: Patient Active Problem List     Diagnosis Date Noted  . Right foot pain 02/01/2017  . Achilles rupture, right, subsequent encounter 10/22/2016   Past Medical History:  Diagnosis Date  . Achilles tendon rupture 03/14/2016   right    No family history on file.  Past Surgical History:  Procedure Laterality Date  . ACHILLES TENDON SURGERY Right 03/18/2016   Procedure: RIGHT ACHILLES TENDON REPAIR;  Surgeon: Tarry KosNaiping M Xu, MD;  Location: [facility];  Service: Orthopedics;  Laterality: Right;  . SOFT TISSUE BIOPSY Right    heel   Social History   Occupational History  . Not on file.   Social History Main Topics  . Smoking status: Never Smoker  . Smokeless tobacco: Never Used  . Alcohol use No  . Drug use: No  . Sexual activity: Not on file

## 2017-02-05 ENCOUNTER — Telehealth (INDEPENDENT_AMBULATORY_CARE_PROVIDER_SITE_OTHER): Payer: Self-pay | Admitting: Orthopaedic Surgery

## 2017-02-05 NOTE — Telephone Encounter (Signed)
Patient has a few questions about his medication refill. CB # [phone]

## 2017-02-10 NOTE — Telephone Encounter (Signed)
PA done through cover my meds. Pt aware. I will wait for response

## 2017-09-14 ENCOUNTER — Ambulatory Visit (INDEPENDENT_AMBULATORY_CARE_PROVIDER_SITE_OTHER): Payer: BLUE CROSS/BLUE SHIELD | Admitting: Orthopaedic Surgery

## 2017-09-14 DIAGNOSIS — S86011D Strain of right Achilles tendon, subsequent encounter: Secondary | ICD-10-CM | POA: Diagnosis not present

## 2017-09-14 MED ORDER — MUPIROCIN 2 % EX OINT: 1.0000 "application " | TOPICAL_OINTMENT | Freq: Two times a day (BID) | CUTANEOUS | 0 refills | Status: AC

## 2017-09-14 NOTE — Progress Notes (Signed)
   Office Visit Note   Patient: Jonathan [name]           Date of Birth: [birth year]           MRN: [id number] Visit Date: 09/14/2017              Requested by: Georgann Housekeeper, MD [address] [place], [place] [address] PCP: Georgann Housekeeper, MD   Assessment & Plan: Visit Diagnoses:  1. Achilles rupture, right, subsequent encounter     Plan: Recommend Bactroban ointment twice a day. MRI urgently to rule out deep infection. He has had recurrent issues with the surgical wound since surgery that has healed with oral antibiotics. I do also consider if he is reacting to the suture material.  Follow-Up Instructions: Return if symptoms worsen or fail to improve.   Orders:  Orders Placed This Encounter  Procedures  . MR ANKLE RIGHT W WO CONTRAST   Meds ordered this encounter  Medications  . mupirocin ointment (BACTROBAN) 2 %    Sig: Place 1 application into the nose 2 (two) times daily.    Dispense:  22 g    Refill:  0      Procedures: No procedures performed   Clinical Data: No additional findings.   Subjective: Chief Complaint  Patient presents with  . Right Ankle - Pain, Follow-up    Patient is 18 months status post right Achilles tendon repair. He comes in today for hypertrophic scarring and drainage from the surgical incision that has been going on since earlier this summer. The swelling has gotten significantly worse. Denies any constitutional symptoms. He does endorse bloody drainage.    Review of Systems  Constitutional: Negative.   All other systems reviewed and are negative.    Objective: Vital Signs: There were no vitals taken for this visit.  Physical Exam  Constitutional: He is oriented to person, place, and time. He appears well-developed and well-nourished.  Pulmonary/Chest: Effort normal.  Abdominal: Soft.  Neurological: He is alert and oriented to person, place, and time.  Skin: Skin is warm.  Psychiatric: He has a normal mood and  affect. His behavior is normal. Judgment and thought content normal.  Nursing note and vitals reviewed.   Ortho Exam Right ankle exam shows hypertrophic granulation tissue over the surgical scar. There is bloody drainage. This is tender to palpation. No obvious evidence of cellulitis. Specialty Comments:  No specialty comments available.  Imaging: No results found.   PMFS History: Patient Active Problem List   Diagnosis Date Noted  . Right foot pain 02/01/2017  . Achilles rupture, right, subsequent encounter 10/22/2016   Past Medical History:  Diagnosis Date  . Achilles tendon rupture 03/14/2016   right    No family history on file.  Past Surgical History:  Procedure Laterality Date  . ACHILLES TENDON SURGERY Right 03/18/2016   Procedure: RIGHT ACHILLES TENDON REPAIR;  Surgeon: Tarry Kos, MD;  [facility];  Service: Orthopedics;  Laterality: Right;  . SOFT TISSUE BIOPSY Right    heel   Social History   Occupational History  . Not on file.   Social History Main Topics  . Smoking status: Never Smoker  . Smokeless tobacco: Never Used  . Alcohol use No  . Drug use: No  . Sexual activity: Not on file

## 2017-09-28 ENCOUNTER — Ambulatory Visit
Admission: RE | Admit: 2017-09-28 | Discharge: 2017-09-28 | Disposition: A | Discharge status: Home / Self Care | Payer: BLUE CROSS/BLUE SHIELD | Source: Ambulatory Visit | Attending: Orthopaedic Surgery | Admitting: Orthopaedic Surgery

## 2017-09-28 DIAGNOSIS — S86011D Strain of right Achilles tendon, subsequent encounter: Secondary | ICD-10-CM

## 2017-09-28 MED ORDER — GADOBENATE DIMEGLUMINE 529 MG/ML IV SOLN
20.0000 mL | Freq: Once | INTRAVENOUS | Status: AC | PRN
  Administered 2017-09-28: 19 mL via INTRAVENOUS

## 2017-09-29 NOTE — Progress Notes (Signed)
Need to schedule him for I&D right ankle possible vac.  [facility] next Wednesday.  Cultures, hold abx, vanc afterwards, prone position, possible vac.outpatient. [id number], [id number], [id number]

## 2017-09-30 ENCOUNTER — Other Ambulatory Visit (INDEPENDENT_AMBULATORY_CARE_PROVIDER_SITE_OTHER): Payer: Self-pay | Admitting: Orthopaedic Surgery

## 2017-10-05 ENCOUNTER — Encounter (HOSPITAL_COMMUNITY): Payer: Self-pay | Admitting: *Deleted

## 2017-10-05 ENCOUNTER — Telehealth (INDEPENDENT_AMBULATORY_CARE_PROVIDER_SITE_OTHER): Payer: Self-pay | Admitting: *Deleted

## 2017-10-05 NOTE — Telephone Encounter (Signed)
Renee from [facility] called stating she is needing a Prior authorization for code [id number] for sx tomorrow thru [place]. She would like a call back once done.   CB# [phone] ext [phone]

## 2017-10-05 NOTE — Telephone Encounter (Signed)
Called her back and advised her of the pending ref #

## 2017-10-05 NOTE — Progress Notes (Signed)
Spoke with pt for pre-op call. Pt denies cardiac history, chest pain, sob or hx of diabetes.

## 2017-10-05 NOTE — Telephone Encounter (Signed)
To Amy. 

## 2017-10-06 ENCOUNTER — Ambulatory Visit (HOSPITAL_COMMUNITY): Payer: BLUE CROSS/BLUE SHIELD | Admitting: Certified Registered"

## 2017-10-06 ENCOUNTER — Ambulatory Visit (HOSPITAL_COMMUNITY)
Admission: RE | Admit: 2017-10-06 | Discharge: 2017-10-06 | Disposition: A | Discharge status: Home / Self Care | Payer: BLUE CROSS/BLUE SHIELD | Source: Ambulatory Visit | Attending: Orthopaedic Surgery | Admitting: Orthopaedic Surgery

## 2017-10-06 ENCOUNTER — Encounter (HOSPITAL_COMMUNITY)
Admission: RE | Disposition: A | Discharge status: Home / Self Care | Payer: Self-pay | Source: Ambulatory Visit | Attending: Orthopaedic Surgery

## 2017-10-06 ENCOUNTER — Encounter (HOSPITAL_COMMUNITY): Payer: Self-pay

## 2017-10-06 DIAGNOSIS — L089 Local infection of the skin and subcutaneous tissue, unspecified: Secondary | ICD-10-CM | POA: Insufficient documentation

## 2017-10-06 DIAGNOSIS — S86011S Strain of right Achilles tendon, sequela: Secondary | ICD-10-CM | POA: Diagnosis not present

## 2017-10-06 HISTORY — PX: I & D EXTREMITY: SHX5045

## 2017-10-06 LAB — CBC
HCT: 46.3 % (ref 39.0–52.0)
Hemoglobin: 15.7 g/dL (ref 13.0–17.0)
MCH: 29.6 pg (ref 26.0–34.0)
MCHC: 33.9 g/dL (ref 30.0–36.0)
MCV: 87.4 fL (ref 78.0–100.0)
Platelets: 191 10*3/uL (ref 150–400)
RBC: 5.3 MIL/uL (ref 4.22–5.81)
RDW: 13 % (ref 11.5–15.5)
WBC: 7.1 10*3/uL (ref 4.0–10.5)

## 2017-10-06 SURGERY — IRRIGATION AND DEBRIDEMENT EXTREMITY
Anesthesia: General | Site: Ankle | Laterality: Right

## 2017-10-06 MED ORDER — VANCOMYCIN HCL 1000 MG IV SOLR
INTRAVENOUS | Status: DC | PRN
  Administered 2017-10-06: 1000 mg via INTRAVENOUS

## 2017-10-06 MED ORDER — LIDOCAINE 2% (20 MG/ML) 5 ML SYRINGE
INTRAMUSCULAR | Status: AC
  Filled 2017-10-06: qty 5

## 2017-10-06 MED ORDER — SULFAMETHOXAZOLE-TRIMETHOPRIM 800-160 MG PO TABS: 1.0000 | ORAL_TABLET | Freq: Two times a day (BID) | ORAL | 0 refills | Status: AC

## 2017-10-06 MED ORDER — ROCURONIUM BROMIDE 50 MG/5ML IV SOLN
INTRAVENOUS | Status: AC
  Filled 2017-10-06: qty 1

## 2017-10-06 MED ORDER — FENTANYL CITRATE (PF) 100 MCG/2ML IJ SOLN: 25.0000 ug | INTRAMUSCULAR | Status: DC | PRN

## 2017-10-06 MED ORDER — MIDAZOLAM HCL 2 MG/2ML IJ SOLN
INTRAMUSCULAR | Status: DC | PRN
  Administered 2017-10-06: 2 mg via INTRAVENOUS

## 2017-10-06 MED ORDER — 0.9 % SODIUM CHLORIDE (POUR BTL) OPTIME
TOPICAL | Status: DC | PRN
  Administered 2017-10-06: 1000 mL

## 2017-10-06 MED ORDER — FENTANYL CITRATE (PF) 100 MCG/2ML IJ SOLN
INTRAMUSCULAR | Status: DC | PRN
  Administered 2017-10-06: 100 ug via INTRAVENOUS

## 2017-10-06 MED ORDER — DEXAMETHASONE SODIUM PHOSPHATE 10 MG/ML IJ SOLN
INTRAMUSCULAR | Status: DC | PRN
  Administered 2017-10-06: 10 mg via INTRAVENOUS

## 2017-10-06 MED ORDER — SODIUM CHLORIDE 0.9 % IR SOLN
Status: DC | PRN
  Administered 2017-10-06: 3000 mL

## 2017-10-06 MED ORDER — LACTATED RINGERS IV SOLN
INTRAVENOUS | Status: DC
  Administered 2017-10-06: 13:00:00 via INTRAVENOUS

## 2017-10-06 MED ORDER — VANCOMYCIN HCL IN DEXTROSE 1-5 GM/200ML-% IV SOLN
INTRAVENOUS | Status: AC
  Filled 2017-10-06: qty 200

## 2017-10-06 MED ORDER — FENTANYL CITRATE (PF) 250 MCG/5ML IJ SOLN
INTRAMUSCULAR | Status: AC
  Filled 2017-10-06: qty 5

## 2017-10-06 MED ORDER — ONDANSETRON HCL 4 MG/2ML IJ SOLN
INTRAMUSCULAR | Status: DC | PRN
  Administered 2017-10-06: 4 mg via INTRAVENOUS

## 2017-10-06 MED ORDER — SUGAMMADEX SODIUM 200 MG/2ML IV SOLN
INTRAVENOUS | Status: DC | PRN
  Administered 2017-10-06: 200 mg via INTRAVENOUS

## 2017-10-06 MED ORDER — MIDAZOLAM HCL 2 MG/2ML IJ SOLN
INTRAMUSCULAR | Status: AC
  Filled 2017-10-06: qty 2

## 2017-10-06 MED ORDER — PROPOFOL 10 MG/ML IV BOLUS
INTRAVENOUS | Status: DC | PRN
  Administered 2017-10-06: 200 mg via INTRAVENOUS

## 2017-10-06 MED ORDER — LIDOCAINE 2% (20 MG/ML) 5 ML SYRINGE
INTRAMUSCULAR | Status: DC | PRN
  Administered 2017-10-06: 100 mg via INTRAVENOUS

## 2017-10-06 MED ORDER — HYDROCODONE-ACETAMINOPHEN 5-325 MG PO TABS: 1.0000 | ORAL_TABLET | Freq: Four times a day (QID) | ORAL | 0 refills | Status: AC | PRN

## 2017-10-06 MED ORDER — ROCURONIUM BROMIDE 10 MG/ML (PF) SYRINGE
PREFILLED_SYRINGE | INTRAVENOUS | Status: DC | PRN
  Administered 2017-10-06: 50 mg via INTRAVENOUS

## 2017-10-06 MED ORDER — PROPOFOL 10 MG/ML IV BOLUS
INTRAVENOUS | Status: AC
  Filled 2017-10-06: qty 20

## 2017-10-06 SURGICAL SUPPLY — 36 items
BNDG COHESIVE 4X5 TAN STRL (GAUZE/BANDAGES/DRESSINGS) ×2 IMPLANT
BNDG COHESIVE 6X5 TAN STRL LF (GAUZE/BANDAGES/DRESSINGS) ×2 IMPLANT
BNDG GAUZE ELAST 4 BULKY (GAUZE/BANDAGES/DRESSINGS) ×2 IMPLANT
CORDS BIPOLAR (ELECTRODE) ×2 IMPLANT
COVER SURGICAL LIGHT HANDLE (MISCELLANEOUS) ×2 IMPLANT
DRAPE U-SHAPE 47X51 STRL (DRAPES) ×2 IMPLANT
ELECT REM PT RETURN 9FT ADLT (ELECTROSURGICAL) ×2
ELECTRODE REM PT RTRN 9FT ADLT (ELECTROSURGICAL) ×1 IMPLANT
GAUZE SPONGE 4X4 12PLY STRL (GAUZE/BANDAGES/DRESSINGS) ×2 IMPLANT
GAUZE XEROFORM 1X8 LF (GAUZE/BANDAGES/DRESSINGS) ×2 IMPLANT
GAUZE XEROFORM 5X9 LF (GAUZE/BANDAGES/DRESSINGS) ×2 IMPLANT
GLOVE SKINSENSE NS SZ7.5 (GLOVE) ×2
GLOVE SKINSENSE STRL SZ7.5 (GLOVE) ×2 IMPLANT
GOWN STRL REIN XL XLG (GOWN DISPOSABLE) ×4 IMPLANT
KIT BASIN OR (CUSTOM PROCEDURE TRAY) ×2 IMPLANT
KIT ROOM TURNOVER OR (KITS) ×2 IMPLANT
MANIFOLD NEPTUNE II (INSTRUMENTS) ×2 IMPLANT
PACK ORTHO EXTREMITY (CUSTOM PROCEDURE TRAY) ×2 IMPLANT
PAD ABD 8X10 STRL (GAUZE/BANDAGES/DRESSINGS) ×2 IMPLANT
PAD ARMBOARD 7.5X6 YLW CONV (MISCELLANEOUS) ×4 IMPLANT
PADDING CAST ABS 4INX4YD NS (CAST SUPPLIES) ×1
PADDING CAST ABS COTTON 4X4 ST (CAST SUPPLIES) ×1 IMPLANT
PADDING CAST COTTON 6X4 STRL (CAST SUPPLIES) ×2 IMPLANT
SCRUB BETADINE 4OZ XXX (MISCELLANEOUS) ×2 IMPLANT
SOLUTION BETADINE 4OZ (MISCELLANEOUS) ×2 IMPLANT
SPONGE LAP 18X18 X RAY DECT (DISPOSABLE) ×2 IMPLANT
STOCKINETTE IMPERVIOUS 9X36 MD (GAUZE/BANDAGES/DRESSINGS) ×2 IMPLANT
SUT ETHILON 2 0 FS 18 (SUTURE) ×12 IMPLANT
SWAB CULTURE ESWAB REG 1ML (MISCELLANEOUS) ×2 IMPLANT
TOWEL OR 17X24 6PK STRL BLUE (TOWEL DISPOSABLE) ×2 IMPLANT
TOWEL OR 17X26 10 PK STRL BLUE (TOWEL DISPOSABLE) ×2 IMPLANT
TUBE CONNECTING 12X1/4 (SUCTIONS) ×2 IMPLANT
TUBING CYSTO DISP (UROLOGICAL SUPPLIES) ×2 IMPLANT
UNDERPAD 30X30 (UNDERPADS AND DIAPERS) ×4 IMPLANT
WATER STERILE IRR 1000ML POUR (IV SOLUTION) ×2 IMPLANT
YANKAUER SUCT BULB TIP NO VENT (SUCTIONS) ×2 IMPLANT

## 2017-10-06 NOTE — Anesthesia Procedure Notes (Signed)
Procedure Name: Intubation Date/Time: 10/06/2017 2:47 PM Performed by: Sampson Si E Pre-anesthesia Checklist: Patient identified, Emergency Drugs available, Suction available and Patient being monitored Patient Re-evaluated:Patient Re-evaluated prior to induction Oxygen Delivery Method: Circle System Utilized Preoxygenation: Pre-oxygenation with 100% oxygen Induction Type: IV induction Ventilation: Mask ventilation without difficulty Laryngoscope Size: Mac and 3 Grade View: Grade II Tube type: Oral Tube size: 7.5 mm Number of attempts: 1 Airway Equipment and Method: Stylet and Oral airway Placement Confirmation: ETT inserted through vocal cords under direct vision,  positive ETCO2 and breath sounds checked- equal and bilateral Secured at: 22 cm Tube secured with: Tape Dental Injury: Teeth and Oropharynx as per pre-operative assessment

## 2017-10-06 NOTE — Anesthesia Preprocedure Evaluation (Signed)
Anesthesia Evaluation  Patient identified by MRN, date of birth, ID band Patient awake    Reviewed: Allergy & Precautions, H&P , Patient's Chart, lab work & pertinent test results, reviewed documented beta blocker date and time   Airway Mallampati: II  TM Distance: >3 FB Neck ROM: full    Dental no notable dental hx.    Pulmonary    Pulmonary exam normal breath sounds clear to auscultation       Cardiovascular  Rhythm:regular Rate:Normal     Neuro/Psych    GI/Hepatic   Endo/Other    Renal/GU      Musculoskeletal   Abdominal   Peds  Hematology   Anesthesia Other Findings   Reproductive/Obstetrics                             Anesthesia Physical Anesthesia Plan  ASA: II  Anesthesia Plan: General   Post-op Pain Management:    Induction: Intravenous  PONV Risk Score and Plan:   Airway Management Planned: Oral ETT  Additional Equipment:   Intra-op Plan:   Post-operative Plan: Extubation in OR  Informed Consent: I have reviewed the patients History and Physical, chart, labs and discussed the procedure including the risks, benefits and alternatives for the proposed anesthesia with the patient or authorized representative who has indicated his/her understanding and acceptance.   Dental Advisory Given  Plan Discussed with: CRNA and Surgeon  Anesthesia Plan Comments: (  )        Anesthesia Quick Evaluation  

## 2017-10-06 NOTE — H&P (Signed)
    PREOPERATIVE H&P  Chief Complaint: infection right ankle  HPI: Jonathan [name] is a 51 y.o. male who presents for surgical treatment of infection right ankle.  He denies any changes in medical history.  Past Medical History:  Diagnosis Date  . Achilles tendon rupture 03/14/2016   right   Past Surgical History:  Procedure Laterality Date  . ACHILLES TENDON SURGERY Right 03/18/2016   Procedure: RIGHT ACHILLES TENDON REPAIR;  Surgeon: Tarry KosNaiping M Bren Borys, MD;  Location: [facility];  Service: Orthopedics;  Laterality: Right;  . SOFT TISSUE BIOPSY Right    heel   Social History   Social History  . Marital status: Married    Spouse name: N/A  . Number of children: N/A  . Years of education: N/A   Social History Main Topics  . Smoking status: Never Smoker  . Smokeless tobacco: Never Used  . Alcohol use No  . Drug use: No  . Sexual activity: Not Asked   Other Topics Concern  . None   Social History Narrative  . None   Family History  Problem Relation Age of Onset  . Dementia Mother    No Known Allergies Prior to Admission medications   Medication Sig Start Date End Date Taking? Authorizing Provider  mupirocin ointment (BACTROBAN) 2 % Place 1 application into the nose 2 (two) times daily. 09/14/17  Yes Tarry KosXu, Amayah Staheli M, MD  Multiple Vitamin (MULTIVITAMIN) tablet Take 1 tablet by mouth daily.    [provider]     Positive ROS: All other systems have been reviewed and were otherwise negative with the exception of those mentioned in the HPI and as above.  Physical Exam: General: Alert, no acute distress Cardiovascular: No pedal edema Respiratory: No cyanosis, no use of accessory musculature GI: abdomen soft Skin: No lesions in the area of chief complaint Neurologic: Sensation intact distally Psychiatric: Patient is competent for consent with normal mood and affect Lymphatic: no lymphedema  MUSCULOSKELETAL: exam stable  Assessment: infection  right ankle  Plan: Plan for Procedure(s): IRRIGATION AND DEBRIDEMENT RIGHT ANKLE, POSSIBLE WOUND VAC  The risks benefits and alternatives were discussed with the patient including but not limited to the risks of nonoperative treatment, versus surgical intervention including infection, bleeding, nerve injury,  blood clots, cardiopulmonary complications, morbidity, mortality, among others, and they were willing to proceed.   Glee ArvinMichael Dody Smartt, MD   10/06/2017 1:54 PM

## 2017-10-06 NOTE — Discharge Instructions (Signed)
Postoperative instructions:  Weightbearing instructions: as tolerated in CAM boot  Dressing instructions: Keep your dressing and/or splint clean and dry at all times.  It will be removed at your first post-operative appointment.  Your stitches and/or staples will be removed at this visit.  Incision instructions:  Do not soak your incision for 3 weeks after surgery.  If the incision gets wet, pat dry and do not scrub the incision.  Pain control:  You have been given a prescription to be taken as directed for post-operative pain control.  In addition, elevate the operative extremity above the heart at all times to prevent swelling and throbbing pain.  Take over-the-counter Colace, 100mg  by mouth twice a day while taking narcotic pain medications to help prevent constipation.  Follow up appointments: 1) 10-14 days for suture removal and wound check. 2) Dr. Roda ShuttersXu as scheduled.   -------------------------------------------------------------------------------------------------------------  After Surgery Pain Control:  After your surgery, post-surgical discomfort or pain is likely. This discomfort can last several days to a few weeks. At certain times of the day your discomfort may be more intense.  Did you receive a nerve block?  A nerve block can provide pain relief for one hour to two days after your surgery. As long as the nerve block is working, you will experience little or no sensation in the area the surgeon operated on.  As the nerve block wears off, you will begin to experience pain or discomfort. It is very important that you begin taking your prescribed pain medication before the nerve block fully wears off. Treating your pain at the first sign of the block wearing off will ensure your pain is better controlled and more tolerable when full-sensation returns. Do not wait until the pain is intolerable, as the medicine will be less effective. It is better to treat pain in advance than to try  and catch up.  General Anesthesia:  If you did not receive a nerve block during your surgery, you will need to start taking your pain medication shortly after your surgery and should continue to do so as prescribed by your surgeon.  Pain Medication:  Most commonly we prescribe Vicodin and Percocet for post-operative pain. Both of these medications contain a combination of acetaminophen (Tylenol) and a narcotic to help control pain.   It takes between 30 and 45 minutes before pain medication starts to work. It is important to take your medication before your pain level gets too intense.   Nausea is a common side effect of many pain medications. You will want to eat something before taking your pain medicine to help prevent nausea.   If you are taking a prescription pain medication that contains acetaminophen, we recommend that you do not take additional over the counter acetaminophen (Tylenol).  Other pain relieving options:   Using a cold pack to ice the affected area a few times a day (15 to 20 minutes at a time) can help to relieve pain, reduce swelling and bruising.   Elevation of the affected area can also help to reduce pain and swelling.

## 2017-10-06 NOTE — Progress Notes (Signed)
Orthopedic Tech Progress Note Patient Details:  Jonathan [name] 03/06/1966 [id number]  Ortho Devices Type of Ortho Device: CAM walker Ortho Device/Splint Location: RLE Ortho Device/Splint Interventions: Ordered, Application   Jennye MoccasinHughes, Albertus Chiarelli Craig 10/06/2017, 3:53 PM

## 2017-10-06 NOTE — Transfer of Care (Signed)
Immediate Anesthesia Transfer of Care Note  Patient: Jonathan [name]  Procedure(s) Performed: IRRIGATION AND DEBRIDEMENT RIGHT ANKLE (Right Ankle)  Patient Location: PACU  Anesthesia Type:General  Level of Consciousness: drowsy and patient cooperative  Airway & Oxygen Therapy: Patient Spontanous Breathing and Patient connected to nasal cannula oxygen  Post-op Assessment: Report given to RN  Post vital signs: Reviewed and stable  Last Vitals:  Vitals:   10/06/17 1225 10/06/17 1538  BP: 126/63   Pulse: 78   Resp: 18   Temp: 36.7 C (P) 36.8 C  SpO2: 100%     Last Pain:  Vitals:   10/06/17 1538  TempSrc:   PainSc: (P) 0-No pain         Complications: No apparent anesthesia complications

## 2017-10-06 NOTE — Op Note (Signed)
   Date of Surgery: 10/06/2017  INDICATIONS: Mr. Freida BusmanDalton is a 51 y.o.-year-old male with a right posterior ankle wound;  The patient did consent to the procedure after discussion of the risks and benefits.  PREOPERATIVE DIAGNOSIS: Right posterior ankle wound  POSTOPERATIVE DIAGNOSIS: Same.  PROCEDURE:  1. Right ankle excisional debridement of skin, subcutaneous tissue, achilles tendon 3 x 3 cm 2. Adjacent tissue rearrangement right ankle 2 cm  SURGEON: N. Glee ArvinMichael Xu, M.D.  ASSIST: none.  ANESTHESIA:  general  IV FLUIDS AND URINE: See anesthesia.  ESTIMATED BLOOD LOSS: minimal mL.  IMPLANTS: none  DRAINS: none  COMPLICATIONS: None.  DESCRIPTION OF PROCEDURE: The patient was brought to the operating room and placed prone on the operating table.  The patient had been signed prior to the procedure and this was documented. The patient had the anesthesia placed by the anesthesiologist.  A time-out was performed to confirm that this was the correct patient, site, side and location. The patient did receive antibiotics prior to the incision and was re-dosed during the procedure as needed at indicated intervals.  A tourniquet was not placed.  The patient had the operative extremity prepped and draped in the standard surgical fashion.    I first began by excising the hypergranulation tissue Over the posterior aspect of the Achilles tendon. This was done sharply with a blade. Once this was done the cultures were then taken and then antibiotic's were given afterwards. I did not find any evidence of frank purulence. He did have a couple of suture knots that were just deep to the surface of the Achilles that looked like it may have caused a reaction.  Sharp excisional debridement was then performed using a knife and Roger of the subcutaneous tissue and the Achilles tendon. Overall the tendon was in continuity had demonstrated healing. We irrigated with 3 L of normal saline. Adjacent tissue  rearrangement was then performed in order to close the wound without tension. Sterile dressings were applied. Patient tolerated procedure well and no immediate complications.  POSTOPERATIVE PLAN: Patient will need to ambulate in a Cam Walker for the next several weeks until the incision is healed.  Mayra ReelN. Michael Xu, MD [facility] [phone] 3:24 PM

## 2017-10-07 ENCOUNTER — Encounter (HOSPITAL_COMMUNITY): Payer: Self-pay | Admitting: Orthopaedic Surgery

## 2017-10-07 NOTE — Anesthesia Postprocedure Evaluation (Signed)
Anesthesia Post Note  Patient: Jonathan [name]  Procedure(s) Performed: IRRIGATION AND DEBRIDEMENT RIGHT ANKLE (Right Ankle)     Patient location during evaluation: PACU Anesthesia Type: General Level of consciousness: awake and alert Pain management: pain level controlled Vital Signs Assessment: post-procedure vital signs reviewed and stable Respiratory status: spontaneous breathing, nonlabored ventilation, respiratory function stable and patient connected to nasal cannula oxygen Cardiovascular status: blood pressure returned to baseline and stable Postop Assessment: no apparent nausea or vomiting Anesthetic complications: no    Last Vitals:  Vitals:   10/06/17 1617 10/06/17 1618  BP: 119/77   Pulse: 81 83  Resp: 11 12  Temp: 36.8 C   SpO2: 98% 99%    Last Pain:  Vitals:   10/06/17 1617  TempSrc:   PainSc: 0-No pain                 Mckinlee Dunk EDWARD

## 2017-10-08 LAB — AEROBIC CULTURE W GRAM STAIN (SUPERFICIAL SPECIMEN): Culture: NO GROWTH

## 2017-10-11 LAB — ANAEROBIC CULTURE

## 2017-10-14 ENCOUNTER — Ambulatory Visit (INDEPENDENT_AMBULATORY_CARE_PROVIDER_SITE_OTHER): Payer: BLUE CROSS/BLUE SHIELD | Admitting: Orthopaedic Surgery

## 2017-10-14 DIAGNOSIS — S86011D Strain of right Achilles tendon, subsequent encounter: Secondary | ICD-10-CM

## 2017-10-14 NOTE — Progress Notes (Signed)
Mr. Jonathan [name] follows up today status post debridement of his Achilles wound and closure.  He is here for his 1 week incision check.  He denies any significant pain.  His incision is clean dry and intact without any signs of infection or breakdown.  There is minimal swelling.  We redressed the wound today.  He needs to limit his standing or walking to at most 30 minutes at a time.  Follow-up in 2 weeks for suture removal.  He knows to finish his 4-week course of Bactrim.

## 2017-10-28 ENCOUNTER — Ambulatory Visit (INDEPENDENT_AMBULATORY_CARE_PROVIDER_SITE_OTHER): Payer: BLUE CROSS/BLUE SHIELD | Admitting: Orthopaedic Surgery

## 2017-10-28 ENCOUNTER — Encounter (INDEPENDENT_AMBULATORY_CARE_PROVIDER_SITE_OTHER): Payer: Self-pay | Admitting: Orthopaedic Surgery

## 2017-10-28 DIAGNOSIS — S86011D Strain of right Achilles tendon, subsequent encounter: Secondary | ICD-10-CM

## 2017-10-28 NOTE — Progress Notes (Signed)
Mr. Jonathan [name] is 3 weeks status post irrigation debridement and closure of a wound dehiscence from his surgical wound.  He is doing well.  He is on 4 weeks of Bactrim.  He denies any significant pain.  The incision has healed and the sutures removed today.  There is no signs of infection.  Continue to finish out the antibiotics.  Discontinue Cam walker and increased activity as tolerated.  Questions encouraged and answered.  Follow-up as needed.

## 2018-12-13 IMAGING — MR MR ANKLE*R* W/O CM
6 series · 36 of 40 positions shown · non-contrast
Comparison: None.

CLINICAL DATA: Right ankle pain, along the anterolateral talus

EXAM:
MRI OF THE RIGHT ANKLE WITHOUT CONTRAST
TECHNIQUE: Multiplanar, multisequence MR imaging of the ankle was performed. No
intravenous contrast was administered.

[Series 4: T2 fat-sat · axial · 3.5mm · 0.49mm/px · z∈[-100,+34]mm · 7 of 32 slices shown (1 of 3)]
[im 1/32]
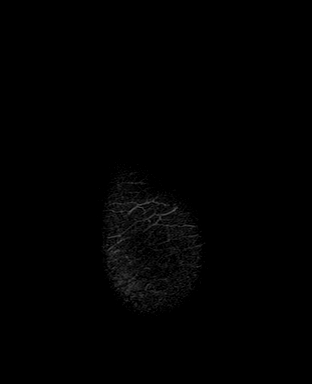
[im 6/32]
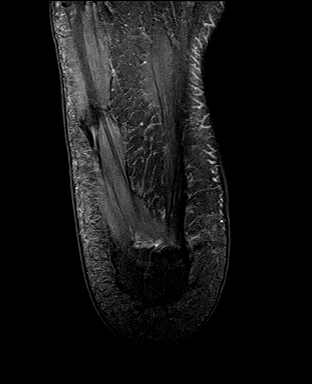
[im 11/32]
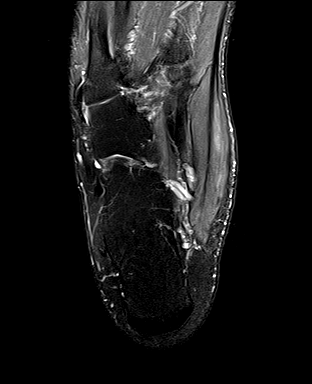
[im 16/32]
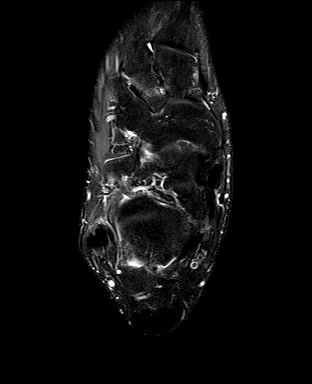
[im 21/32]
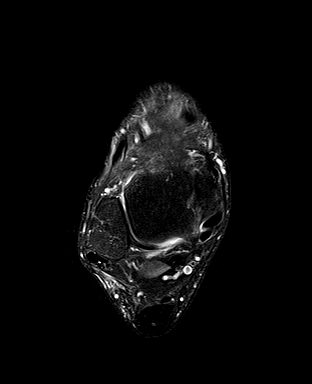
[im 26/32]
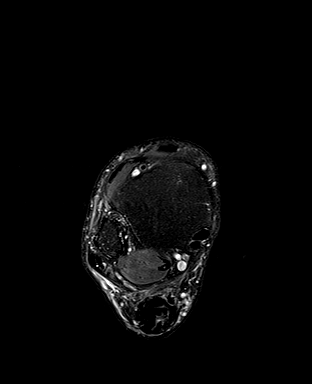
[im 32/32]
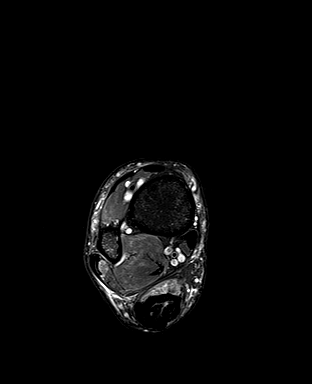

[Series 5: PD fat-sat · axial · 3.5mm · 0.49mm/px · z∈[-100,+34]mm · 7 of 32 slices shown]
[im 1/32]
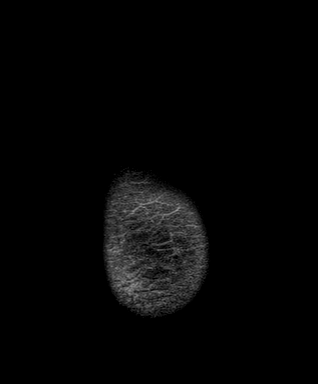
[im 6/32]
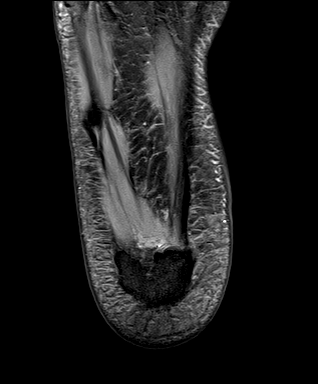
[im 11/32]
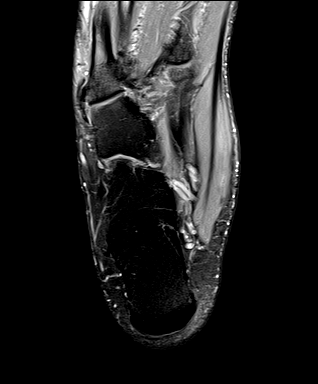
[im 16/32]
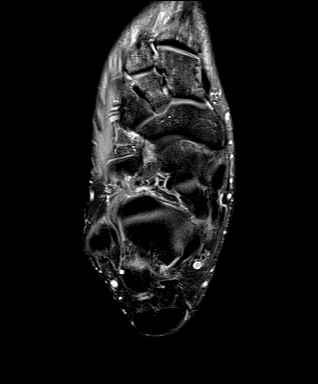
[im 21/32]
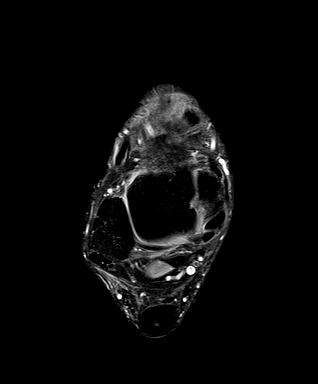
[im 26/32]
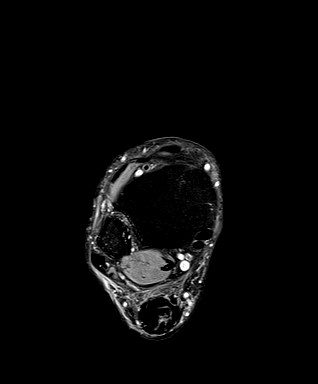
[im 32/32]
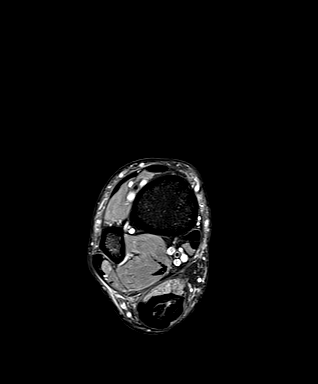

[Series 6: T1 · sagittal · 3.5mm · 0.45mm/px · 5 of 20 slices shown]
[im 1/20]
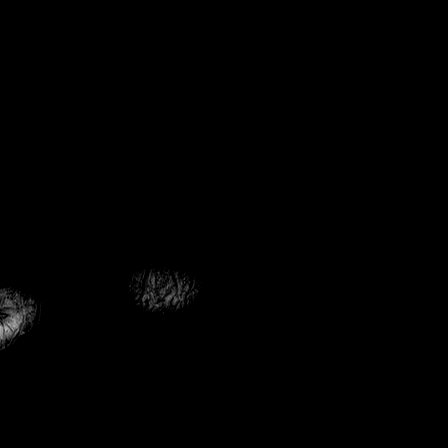
[im 5/20]
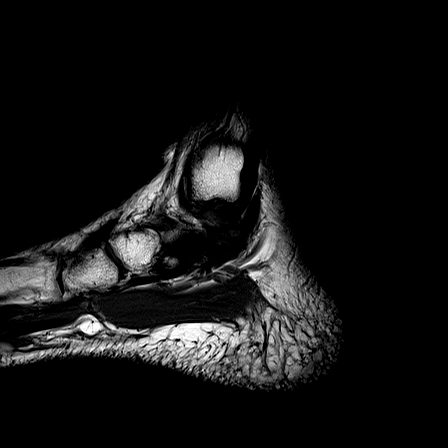
[im 10/20]
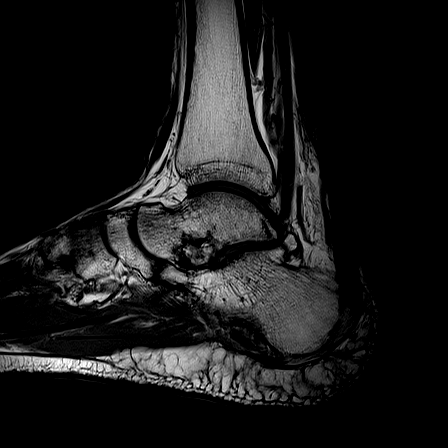
[im 15/20]
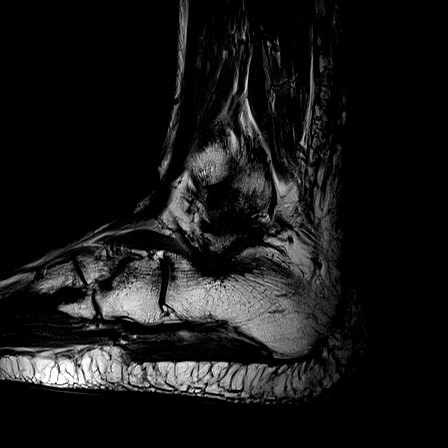
[im 20/20]
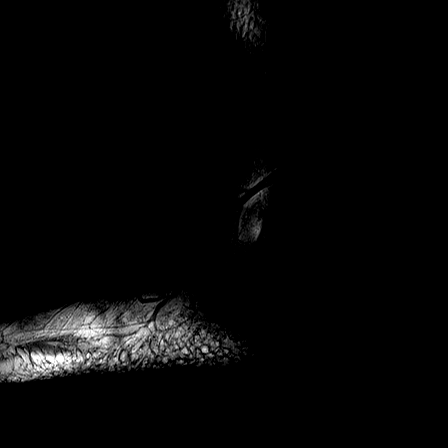

[Series 7: T2 fat-sat · sagittal · 3.5mm · 0.47mm/px · 5 of 20 slices shown (2 of 3)]
[im 1/20]
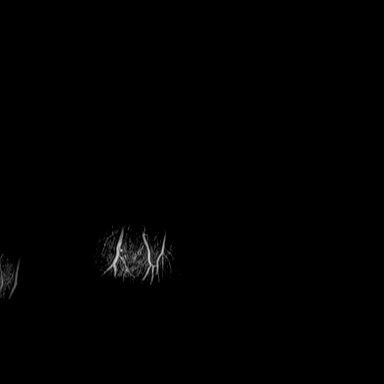
[im 5/20]
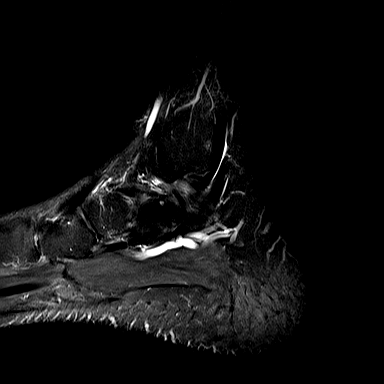
[im 10/20]
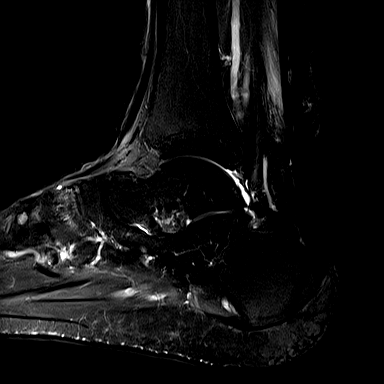
[im 15/20]
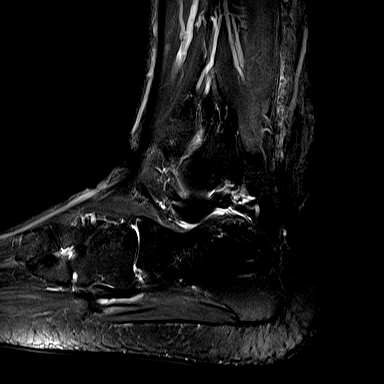
[im 20/20]
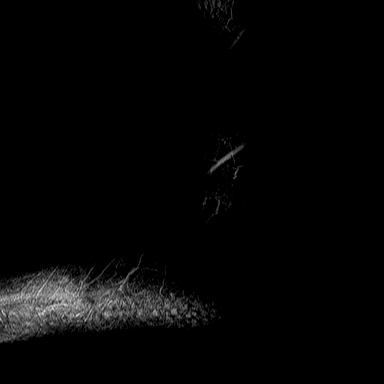

[Series 8: T2 fat-sat · coronal · 3.5mm · 0.45mm/px · 8 of 36 slices shown (3 of 3)]
[im 1/36]
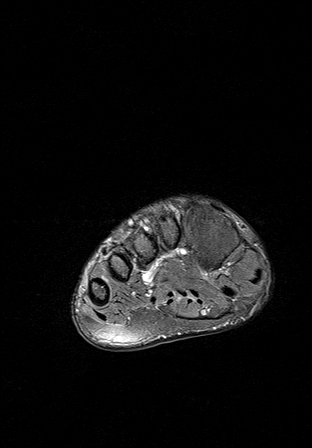
[im 6/36]
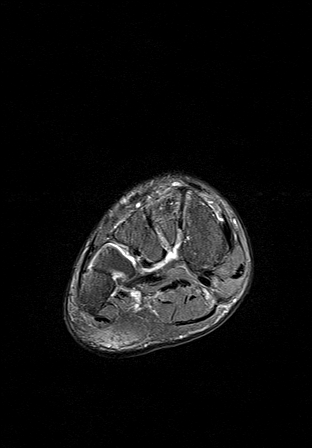
[im 11/36]
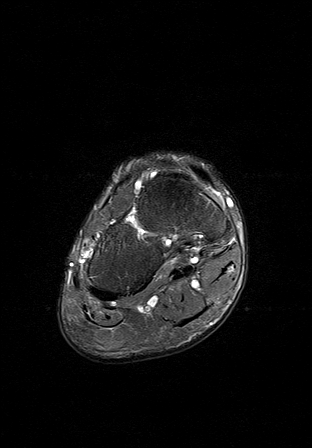
[im 16/36]
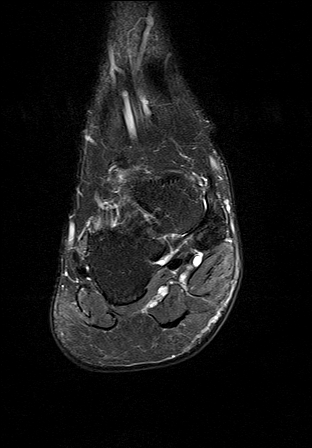
[im 21/36]
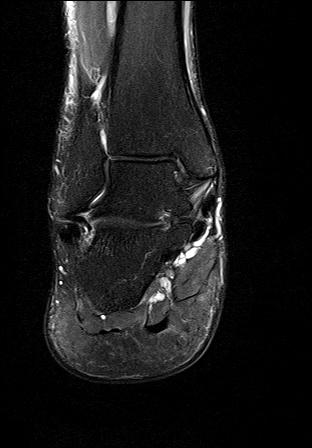
[im 26/36]
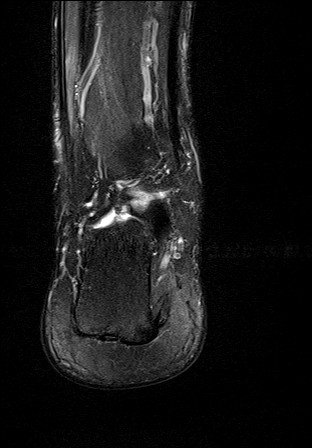
[im 31/36]
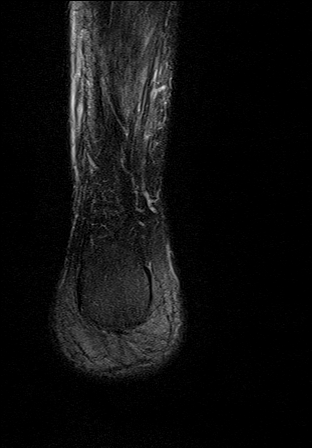
[im 36/36]
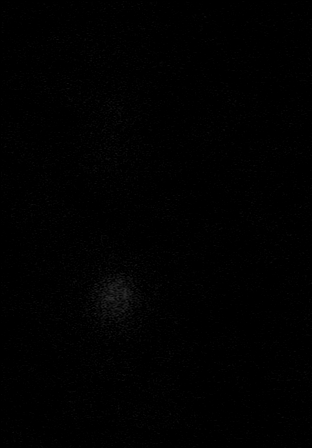

[Series 9: STIR · coronal · 3.5mm · 0.78mm/px · 4 of 36 slices shown]
[im 1/36]
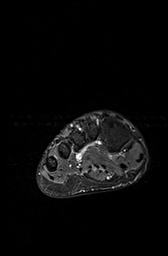
[im 6/36]
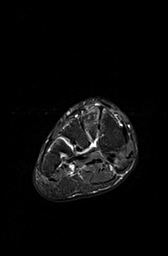
[im 11/36]
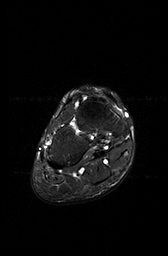
[im 16/36]
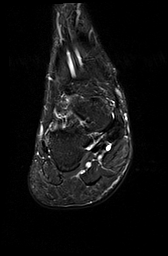

[36 of 40 positions shown; findings below may reference images not displayed]

FINDINGS: TENDONS

Peroneal: Peroneal longus tendon intact. Peroneal brevis intact.

Posteromedial: Posterior tibial tendon intact. Flexor hallucis
longus tendon intact. Flexor digitorum longus tendon intact.

Anterior: Tibialis anterior tendon intact. Extensor hallucis longus
tendon intact Extensor digitorum longus tendon intact.

Achilles: Severe thickening of the Achilles tendon approximately 5
cm from the calcaneal insertion with large bundles of intact fibers
consistent with prior Achilles repair. Mild edema in the peripheral
soleus muscle abutting the Achilles tendon at the site of repair
concerning for mild strain versus myositis.

Plantar Fascia: Intact.

LIGAMENTS

Lateral: Anterior talofibular ligament intact. Calcaneofibular
ligament intact. Posterior talofibular ligament intact. Anterior and
posterior tibiofibular ligaments intact.

Medial: Deltoid ligament intact. Spring ligament intact.

CARTILAGE

Ankle Joint: No joint effusion. Normal ankle mortise. No chondral
defect.

Subtalar Joints/Sinus Tarsi: Normal subtalar joints. No subtalar
joint effusion. Normal sinus tarsi.

Bones: No acute fracture or dislocation. Mild osteoarthritis of the
talonavicular joint and navicular - medial and middle cuneiform
joint with mild subchondral reactive marrow edema in the medial and
middle cuneiforms. Broad articulation between the anterior process
of the calcaneus and lateral margin of the navicular without bone
bridging most concerning for fibro-osseous calcaneonavicular
coalition.

Soft Tissue: No fluid collection or hematoma.
IMPRESSION: 1. Severe thickening of the Achilles tendon approximately 5 cm from
the calcaneal insertion with large bundles of intact fibers
consistent with prior Achilles repair. Mild edema in the peripheral
soleus muscle abutting the Achilles tendon at the site of repair
concerning for mild strain versus myositis.
2. Mild osteoarthritis of the talonavicular joint and navicular -
medial and middle cuneiform joint with mild subchondral reactive
marrow edema in the medial and middle cuneiforms.
3. Broad articulation between the anterior process of the calcaneus
and lateral margin of the navicular without bone bridging most
concerning for fibro-osseous calcaneonavicular coalition.

## 2020-12-24 DIAGNOSIS — R7309 Other abnormal glucose: Secondary | ICD-10-CM | POA: Diagnosis not present

## 2021-08-12 DIAGNOSIS — S60552A Superficial foreign body of left hand, initial encounter: Secondary | ICD-10-CM | POA: Diagnosis not present

## 2021-08-12 DIAGNOSIS — W458XXA Other foreign body or object entering through skin, initial encounter: Secondary | ICD-10-CM | POA: Diagnosis not present

## 2021-11-10 DIAGNOSIS — Z125 Encounter for screening for malignant neoplasm of prostate: Secondary | ICD-10-CM | POA: Diagnosis not present

## 2021-11-10 DIAGNOSIS — Z23 Encounter for immunization: Secondary | ICD-10-CM | POA: Diagnosis not present

## 2021-11-10 DIAGNOSIS — E7800 Pure hypercholesterolemia, unspecified: Secondary | ICD-10-CM | POA: Diagnosis not present

## 2021-11-10 DIAGNOSIS — Z0000 Encounter for general adult medical examination without abnormal findings: Secondary | ICD-10-CM | POA: Diagnosis not present

## 2021-11-10 DIAGNOSIS — Z1389 Encounter for screening for other disorder: Secondary | ICD-10-CM | POA: Diagnosis not present

## 2021-12-30 DIAGNOSIS — Z23 Encounter for immunization: Secondary | ICD-10-CM | POA: Diagnosis not present

## 2022-03-13 DIAGNOSIS — D2361 Other benign neoplasm of skin of right upper limb, including shoulder: Secondary | ICD-10-CM | POA: Diagnosis not present

## 2022-03-13 DIAGNOSIS — L708 Other acne: Secondary | ICD-10-CM | POA: Diagnosis not present

## 2022-06-02 DIAGNOSIS — Z23 Encounter for immunization: Secondary | ICD-10-CM | POA: Diagnosis not present

## 2022-12-23 DIAGNOSIS — E7800 Pure hypercholesterolemia, unspecified: Secondary | ICD-10-CM | POA: Diagnosis not present

## 2022-12-23 DIAGNOSIS — Z0000 Encounter for general adult medical examination without abnormal findings: Secondary | ICD-10-CM | POA: Diagnosis not present

## 2022-12-23 DIAGNOSIS — R7303 Prediabetes: Secondary | ICD-10-CM | POA: Diagnosis not present

## 2022-12-23 DIAGNOSIS — Z125 Encounter for screening for malignant neoplasm of prostate: Secondary | ICD-10-CM | POA: Diagnosis not present

## 2022-12-23 DIAGNOSIS — R030 Elevated blood-pressure reading, without diagnosis of hypertension: Secondary | ICD-10-CM | POA: Diagnosis not present

## 2022-12-23 DIAGNOSIS — Z1389 Encounter for screening for other disorder: Secondary | ICD-10-CM | POA: Diagnosis not present

## 2023-04-16 ENCOUNTER — Other Ambulatory Visit: Payer: Self-pay | Admitting: Internal Medicine

## 2023-04-16 ENCOUNTER — Ambulatory Visit
Admission: RE | Admit: 2023-04-16 | Discharge: 2023-04-16 | Disposition: A | Discharge status: Home / Self Care | Payer: 59 | Source: Ambulatory Visit | Attending: Internal Medicine | Admitting: Internal Medicine

## 2023-04-16 DIAGNOSIS — R109 Unspecified abdominal pain: Secondary | ICD-10-CM | POA: Diagnosis not present

## 2023-04-16 DIAGNOSIS — K760 Fatty (change of) liver, not elsewhere classified: Secondary | ICD-10-CM | POA: Diagnosis not present

## 2023-04-16 DIAGNOSIS — R319 Hematuria, unspecified: Secondary | ICD-10-CM | POA: Diagnosis not present

## 2023-04-16 DIAGNOSIS — N200 Calculus of kidney: Secondary | ICD-10-CM | POA: Diagnosis not present

## 2023-04-16 DIAGNOSIS — R1010 Upper abdominal pain, unspecified: Secondary | ICD-10-CM | POA: Diagnosis not present

## 2023-04-26 DIAGNOSIS — R319 Hematuria, unspecified: Secondary | ICD-10-CM | POA: Diagnosis not present

## 2023-05-24 DIAGNOSIS — R310 Gross hematuria: Secondary | ICD-10-CM | POA: Diagnosis not present

## 2023-05-24 DIAGNOSIS — Z87442 Personal history of urinary calculi: Secondary | ICD-10-CM | POA: Diagnosis not present

## 2023-11-11 ENCOUNTER — Ambulatory Visit: Payer: 59 | Admitting: Orthopaedic Surgery

## 2023-11-11 ENCOUNTER — Other Ambulatory Visit (INDEPENDENT_AMBULATORY_CARE_PROVIDER_SITE_OTHER): Payer: 59

## 2023-11-11 DIAGNOSIS — M25522 Pain in left elbow: Secondary | ICD-10-CM

## 2023-11-11 NOTE — Progress Notes (Signed)
Office Visit Note   Patient: Jonathan [name]           Date of Birth: May 04, 1966           MRN: [id number] Visit Date: 11/11/2023              Requested by: Georgann Housekeeper, MD [address] [place],  [place] [address] PCP: Georgann Housekeeper, MD   Assessment & Plan: Visit Diagnoses:  1. Pain in left elbow     Plan: Mr. Malvin is a 57 year old gentleman with left elbow pain impression is lateral epicondylitis and possibly a little bit of radiocapitellar arthritis.  No evidence of radial tunnel syndrome.  We discussed treatment strategies to include counterforce brace, rest, home exercise program, Voltaren gel.  Questions encouraged and answered.  Follow-Up Instructions: No follow-ups on file.   Orders:  Orders Placed This Encounter  Procedures   XR Elbow Complete Left (3+View)   No orders of the defined types were placed in this encounter.     Procedures: No procedures performed   Clinical Data: No additional findings.   Subjective: Chief Complaint  Patient presents with   Left Elbow - Pain    HPI Jonathan [name] is a 57 year old gentleman longtime patient of mine who comes in for evaluation of left elbow pain for about a month.  He reports using a chainsaw recently which may have aggravated the pain.  He is right-hand dominant.  He reports discomfort on the lateral aspect of the elbow.  Denies any numbness and tingling.  Denies any injuries. Review of Systems  Constitutional: Negative.   HENT: Negative.    Eyes: Negative.   Respiratory: Negative.    Cardiovascular: Negative.   Gastrointestinal: Negative.   Endocrine: Negative.   Genitourinary: Negative.   Skin: Negative.   Allergic/Immunologic: Negative.   Neurological: Negative.   Hematological: Negative.   Psychiatric/Behavioral: Negative.    All other systems reviewed and are negative.    Objective: Vital Signs: There were no vitals taken for this visit.  Physical Exam Vitals and nursing  note reviewed.  Constitutional:      Appearance: He is well-developed.  HENT:     Head: Normocephalic and atraumatic.  Eyes:     Pupils: Pupils are equal, round, and reactive to light.  Pulmonary:     Effort: Pulmonary effort is normal.  Abdominal:     Palpations: Abdomen is soft.  Musculoskeletal:        General: Normal range of motion.     Cervical back: Neck supple.  Skin:    General: Skin is warm.  Neurological:     Mental Status: He is alert and oriented to person, place, and time.  Psychiatric:        Behavior: Behavior normal.        Thought Content: Thought content normal.        Judgment: Judgment normal.     Ortho Exam Exam of the left elbow shows full range of motion.  Mild discomfort with forearm pronation supination in the radiocapitellar region.  Tenderness to the posterior lateral aspect of the lateral epicondyle.  Pain with resisted wrist extension.  No pain at the olecranon. Specialty Comments:  No specialty comments available.  Imaging: XR Elbow Complete Left (3+View)  Result Date: 11/11/2023 X-rays of the left elbow show mild degenerative osteophytosis of the olecranon, radial capitellar joint, medial epicondyle.  No acute abnormalities.    PMFS History: Patient Active Problem List  Diagnosis Date Noted   Right foot pain 02/01/2017   Rupture Achilles tendon, right, sequela 10/22/2016   Past Medical History:  Diagnosis Date   Achilles tendon rupture 03/14/2016   right    Family History  Problem Relation Age of Onset   Dementia Mother     Past Surgical History:  Procedure Laterality Date   ACHILLES TENDON SURGERY Right 03/18/2016   Procedure: RIGHT ACHILLES TENDON REPAIR;  Surgeon: Tarry Kos, MD;  Location: [facility];  Service: Orthopedics;  Laterality: Right;   I & D EXTREMITY Right 10/06/2017   Procedure: IRRIGATION AND DEBRIDEMENT RIGHT ANKLE;  Surgeon: Tarry Kos, MD;  Location: [facility];  Service: Orthopedics;   Laterality: Right;   SOFT TISSUE BIOPSY Right    heel   Social History   Occupational History   Not on file  Tobacco Use   Smoking status: Never   Smokeless tobacco: Never  Vaping Use   Vaping status: Never Used  Substance and Sexual Activity   Alcohol use: No   Drug use: No   Sexual activity: Not on file

## 2024-01-16 NOTE — Progress Notes (Unsigned)
Office Visit Note   Patient: Jonathan [name]           Date of Birth: 1966-01-16           MRN: [id number] Visit Date: 01/18/2024              Requested by: Jonathan Housekeeper, MD [address] [place],  [place] [address] PCP: Jonathan Housekeeper, MD   Assessment & Plan: Visit Diagnoses:  1. Chronic low back pain, unspecified back pain laterality, unspecified whether sciatica present     Plan: ***  Follow-Up Instructions: No follow-ups on file.   Orders:  No orders of the defined types were placed in this encounter.  No orders of the defined types were placed in this encounter.     Procedures: No procedures performed   Clinical Data: No additional findings.   Subjective: No chief complaint on file.   HPI  Review of Systems  Constitutional: Negative.   HENT: Negative.    Eyes: Negative.   Respiratory: Negative.    Cardiovascular: Negative.   Gastrointestinal: Negative.   Endocrine: Negative.   Genitourinary: Negative.   Musculoskeletal:  Positive for back pain.  Skin: Negative.   Allergic/Immunologic: Negative.   Neurological: Negative.   Hematological: Negative.   Psychiatric/Behavioral: Negative.    All other systems reviewed and are negative.    Objective: Vital Signs: There were no vitals taken for this visit.  Physical Exam Vitals and nursing note reviewed.  Constitutional:      Appearance: He is well-developed.  HENT:     Head: Normocephalic and atraumatic.  Eyes:     Pupils: Pupils are equal, round, and reactive to light.  Pulmonary:     Effort: Pulmonary effort is normal.  Abdominal:     Palpations: Abdomen is soft.  Musculoskeletal:        General: Normal range of motion.     Cervical back: Neck supple.  Skin:    General: Skin is warm.  Neurological:     Mental Status: He is alert and oriented to person, place, and time.  Psychiatric:        Behavior: Behavior normal.        Thought Content: Thought content normal.         Judgment: Judgment normal.     Ortho Exam  Specialty Comments:  No specialty comments available.  Imaging: No results found.   PMFS History: Patient Active Problem List   Diagnosis Date Noted   Right foot pain 02/01/2017   Rupture Achilles tendon, right, sequela 10/22/2016   Past Medical History:  Diagnosis Date   Achilles tendon rupture 03/14/2016   right    Family History  Problem Relation Age of Onset   Dementia Mother     Past Surgical History:  Procedure Laterality Date   ACHILLES TENDON SURGERY Right 03/18/2016   Procedure: RIGHT ACHILLES TENDON REPAIR;  Surgeon: Tarry Kos, MD;  Location: [facility];  Service: Orthopedics;  Laterality: Right;   I & D EXTREMITY Right 10/06/2017   Procedure: IRRIGATION AND DEBRIDEMENT RIGHT ANKLE;  Surgeon: Tarry Kos, MD;  Location: [facility];  Service: Orthopedics;  Laterality: Right;   SOFT TISSUE BIOPSY Right    heel   Social History   Occupational History   Not on file  Tobacco Use   Smoking status: Never   Smokeless tobacco: Never  Vaping Use   Vaping status: Never Used  Substance and Sexual Activity  Alcohol use: No   Drug use: No   Sexual activity: Not on file

## 2024-01-18 ENCOUNTER — Ambulatory Visit (INDEPENDENT_AMBULATORY_CARE_PROVIDER_SITE_OTHER): Payer: No Typology Code available for payment source | Admitting: Orthopaedic Surgery

## 2024-01-18 DIAGNOSIS — G8929 Other chronic pain: Secondary | ICD-10-CM

## 2024-01-18 DIAGNOSIS — M545 Low back pain, unspecified: Secondary | ICD-10-CM

## 2024-01-19 NOTE — Progress Notes (Signed)
No show

## 2024-06-13 ENCOUNTER — Other Ambulatory Visit (INDEPENDENT_AMBULATORY_CARE_PROVIDER_SITE_OTHER): Payer: Self-pay

## 2024-06-13 ENCOUNTER — Ambulatory Visit: Admitting: Orthopaedic Surgery

## 2024-06-13 DIAGNOSIS — G8929 Other chronic pain: Secondary | ICD-10-CM | POA: Diagnosis not present

## 2024-06-13 DIAGNOSIS — M25562 Pain in left knee: Secondary | ICD-10-CM

## 2024-06-13 MED ORDER — METHYLPREDNISOLONE ACETATE 40 MG/ML IJ SUSP
40.0000 mg | INTRAMUSCULAR | Status: AC | PRN
  Administered 2024-06-13: 40 mg via INTRA_ARTICULAR

## 2024-06-13 MED ORDER — BUPIVACAINE HCL 0.5 % IJ SOLN
2.0000 mL | INTRAMUSCULAR | Status: AC | PRN
  Administered 2024-06-13: 2 mL via INTRA_ARTICULAR

## 2024-06-13 MED ORDER — LIDOCAINE HCL 1 % IJ SOLN
2.0000 mL | INTRAMUSCULAR | Status: AC | PRN
  Administered 2024-06-13: 2 mL

## 2024-06-13 NOTE — Progress Notes (Signed)
 Office Visit Note   Patient: Jonathan [name]           Date of Birth: [birth year]           MRN: [id number] Visit Date: 06/13/2024              Requested by: Ransom Other, MD [address] [place],  [place] [address] PCP: Ransom Other, MD   Assessment & Plan: Visit Diagnoses:  1. Chronic pain of left knee     Plan: History of Present Illness Jonathan [name] is a 58 year old male with knee osteoarthritis who presents with left knee pain following a work-related injury.  He experienced work incident where his left knee pad shifted, causing discomfort and pain. The kneecap shifted, resulting in increased pain over time.  The knee pain has persisted for approximately six weeks, worsening over time. He rates his current pain level as 5 out of 10 and has difficulty with work-related activities, such as climbing ladders.  He occasionally takes ibuprofen for pain relief. There is no significant swelling or loose body sensations, but there is tenderness when the knee is flexed.  Previous x-rays show bone spurs and arthritic changes in the knees.  Physical Exam MUSCULOSKELETAL: No effusion, decent range of motion, no retinacular tenderness, no tenderness along medial joint line, and no lateral tenderness in the left knee.  Assessment and Plan Knee osteoarthritis with exacerbation Exacerbation of left knee osteoarthritis due to mechanical incident. X-rays show significant arthritic changes with patellar bone spurs. Pain impacts daily activities. Cortisone injection preferred for more immediate relief. - Administer cortisone injection to left knee. - Discussed oral anti-inflammatory options, opted for cortisone injection. - Consider gel injections for long-term management pending insurance approval.  Follow-Up Instructions: No follow-ups on file.   Orders:  Orders Placed This Encounter  Procedures   Large Joint Inj   XR KNEE 3 VIEW LEFT   No orders of the defined  types were placed in this encounter.     Procedures: Large Joint Inj: L knee on 06/13/2024 10:57 AM Details: 22 G needle Medications: 2 mL bupivacaine  0.5 %; 2 mL lidocaine  1 %; 40 mg methylPREDNISolone acetate 40 MG/ML Outcome: tolerated well, no immediate complications Patient was prepped and draped in the usual sterile fashion.       Clinical Data: No additional findings.   Subjective: Chief Complaint  Patient presents with   Left Knee - Pain    HPI  Review of Systems  Constitutional: Negative.   HENT: Negative.    Eyes: Negative.   Respiratory: Negative.    Cardiovascular: Negative.   Gastrointestinal: Negative.   Endocrine: Negative.   Genitourinary: Negative.   Skin: Negative.   Allergic/Immunologic: Negative.   Neurological: Negative.   Hematological: Negative.   Psychiatric/Behavioral: Negative.    All other systems reviewed and are negative.    Objective: Vital Signs: There were no vitals taken for this visit.  Physical Exam Vitals and nursing note reviewed.  Constitutional:      Appearance: He is well-developed.  HENT:     Head: Normocephalic and atraumatic.   Eyes:     Pupils: Pupils are equal, round, and reactive to light.   Pulmonary:     Effort: Pulmonary effort is normal.  Abdominal:     Palpations: Abdomen is soft.   Musculoskeletal:        General: Normal range of motion.     Cervical back: Neck supple.   Skin:  General: Skin is warm.   Neurological:     Mental Status: He is alert and oriented to person, place, and time.   Psychiatric:        Behavior: Behavior normal.        Thought Content: Thought content normal.        Judgment: Judgment normal.      Imaging: XR KNEE 3 VIEW LEFT Result Date: 06/13/2024 X-rays of the left knee show diffuse degenerative changes throughout the knee.  Findings are relatively mild to moderate.    PMFS History: Patient Active Problem List   Diagnosis Date Noted   Right foot pain  02/01/2017   Rupture Achilles tendon, right, sequela 10/22/2016   Past Medical History:  Diagnosis Date   Achilles tendon rupture 03/14/2016   right    Family History  Problem Relation Age of Onset   Dementia Mother     Past Surgical History:  Procedure Laterality Date   ACHILLES TENDON SURGERY Right 03/18/2016   Procedure: RIGHT ACHILLES TENDON REPAIR;  Surgeon: Kay CHRISTELLA Cummins, MD;  Location: [facility];  Service: Orthopedics;  Laterality: Right;   I & D EXTREMITY Right 10/06/2017   Procedure: IRRIGATION AND DEBRIDEMENT RIGHT ANKLE;  Surgeon: Cummins Kay CHRISTELLA, MD;  Location: [facility];  Service: Orthopedics;  Laterality: Right;   SOFT TISSUE BIOPSY Right    heel   Social History   Occupational History   Not on file  Tobacco Use   Smoking status: Never   Smokeless tobacco: Never  Vaping Use   Vaping status: Never Used  Substance and Sexual Activity   Alcohol use: No   Drug use: No   Sexual activity: Not on file

## 2024-10-23 ENCOUNTER — Encounter: Payer: Self-pay | Admitting: Radiology
